# Patient Record
Sex: Male | Born: 1955 | Race: White | Hispanic: No | Smoking: Never smoker
Health system: Southern US, Community
[De-identification: ages and names within clinical notes are randomized; demographics above are authoritative.]

## PROBLEM LIST (undated history)

## (undated) DIAGNOSIS — E119 Type 2 diabetes mellitus without complications: Secondary | ICD-10-CM

## (undated) DIAGNOSIS — G4733 Obstructive sleep apnea (adult) (pediatric): Secondary | ICD-10-CM

## (undated) DIAGNOSIS — I2510 Atherosclerotic heart disease of native coronary artery without angina pectoris: Secondary | ICD-10-CM

## (undated) DIAGNOSIS — N183 Chronic kidney disease, stage 3 unspecified: Secondary | ICD-10-CM

## (undated) DIAGNOSIS — I5030 Unspecified diastolic (congestive) heart failure: Secondary | ICD-10-CM

## (undated) DIAGNOSIS — I10 Essential (primary) hypertension: Secondary | ICD-10-CM

## (undated) DIAGNOSIS — E785 Hyperlipidemia, unspecified: Secondary | ICD-10-CM

## (undated) DIAGNOSIS — C61 Malignant neoplasm of prostate: Secondary | ICD-10-CM

## (undated) DIAGNOSIS — I4821 Permanent atrial fibrillation: Secondary | ICD-10-CM

## (undated) HISTORY — PX: WRIST SURGERY: SHX841

---

## 2014-12-21 DIAGNOSIS — I252 Old myocardial infarction: Secondary | ICD-10-CM

## 2014-12-21 DIAGNOSIS — I420 Dilated cardiomyopathy: Secondary | ICD-10-CM | POA: Diagnosis present

## 2014-12-21 DIAGNOSIS — E1165 Type 2 diabetes mellitus with hyperglycemia: Secondary | ICD-10-CM | POA: Insufficient documentation

## 2014-12-21 DIAGNOSIS — I10 Essential (primary) hypertension: Secondary | ICD-10-CM | POA: Diagnosis present

## 2014-12-21 DIAGNOSIS — G4733 Obstructive sleep apnea (adult) (pediatric): Secondary | ICD-10-CM | POA: Insufficient documentation

## 2015-01-04 DIAGNOSIS — I482 Chronic atrial fibrillation, unspecified: Secondary | ICD-10-CM | POA: Insufficient documentation

## 2016-05-25 DIAGNOSIS — E669 Obesity, unspecified: Secondary | ICD-10-CM | POA: Insufficient documentation

## 2018-05-30 DIAGNOSIS — Z7901 Long term (current) use of anticoagulants: Secondary | ICD-10-CM

## 2019-07-31 DIAGNOSIS — N1832 Chronic kidney disease, stage 3b: Secondary | ICD-10-CM

## 2020-11-15 ENCOUNTER — Inpatient Hospital Stay
Admission: EM | Admit: 2020-11-15 | Discharge: 2020-11-18 | DRG: 308 | Disposition: A | Discharge status: Home / Self Care | Payer: Commercial Managed Care - PPO | Attending: Internal Medicine | Admitting: Internal Medicine

## 2020-11-15 ENCOUNTER — Emergency Department: Payer: Commercial Managed Care - PPO

## 2020-11-15 ENCOUNTER — Encounter: Payer: Self-pay | Admitting: *Deleted

## 2020-11-15 DIAGNOSIS — I25118 Atherosclerotic heart disease of native coronary artery with other forms of angina pectoris: Secondary | ICD-10-CM | POA: Diagnosis present

## 2020-11-15 DIAGNOSIS — Z20822 Contact with and (suspected) exposure to covid-19: Secondary | ICD-10-CM | POA: Diagnosis present

## 2020-11-15 DIAGNOSIS — I5033 Acute on chronic diastolic (congestive) heart failure: Secondary | ICD-10-CM | POA: Diagnosis present

## 2020-11-15 DIAGNOSIS — I4891 Unspecified atrial fibrillation: Secondary | ICD-10-CM | POA: Diagnosis not present

## 2020-11-15 DIAGNOSIS — I10 Essential (primary) hypertension: Secondary | ICD-10-CM | POA: Diagnosis present

## 2020-11-15 DIAGNOSIS — I272 Pulmonary hypertension, unspecified: Secondary | ICD-10-CM | POA: Diagnosis present

## 2020-11-15 DIAGNOSIS — E1122 Type 2 diabetes mellitus with diabetic chronic kidney disease: Secondary | ICD-10-CM

## 2020-11-15 DIAGNOSIS — I130 Hypertensive heart and chronic kidney disease with heart failure and stage 1 through stage 4 chronic kidney disease, or unspecified chronic kidney disease: Secondary | ICD-10-CM | POA: Diagnosis present

## 2020-11-15 DIAGNOSIS — I4821 Permanent atrial fibrillation: Secondary | ICD-10-CM | POA: Diagnosis not present

## 2020-11-15 DIAGNOSIS — D72828 Other elevated white blood cell count: Secondary | ICD-10-CM | POA: Diagnosis present

## 2020-11-15 DIAGNOSIS — Z8546 Personal history of malignant neoplasm of prostate: Secondary | ICD-10-CM

## 2020-11-15 DIAGNOSIS — E785 Hyperlipidemia, unspecified: Secondary | ICD-10-CM | POA: Diagnosis present

## 2020-11-15 DIAGNOSIS — Z7901 Long term (current) use of anticoagulants: Secondary | ICD-10-CM

## 2020-11-15 DIAGNOSIS — I5020 Unspecified systolic (congestive) heart failure: Secondary | ICD-10-CM

## 2020-11-15 DIAGNOSIS — Z7982 Long term (current) use of aspirin: Secondary | ICD-10-CM

## 2020-11-15 DIAGNOSIS — R079 Chest pain, unspecified: Secondary | ICD-10-CM

## 2020-11-15 DIAGNOSIS — I420 Dilated cardiomyopathy: Secondary | ICD-10-CM | POA: Diagnosis present

## 2020-11-15 DIAGNOSIS — Z79899 Other long term (current) drug therapy: Secondary | ICD-10-CM

## 2020-11-15 DIAGNOSIS — E039 Hypothyroidism, unspecified: Secondary | ICD-10-CM | POA: Diagnosis present

## 2020-11-15 DIAGNOSIS — Z9114 Patient's other noncompliance with medication regimen: Secondary | ICD-10-CM

## 2020-11-15 DIAGNOSIS — N179 Acute kidney failure, unspecified: Secondary | ICD-10-CM | POA: Diagnosis present

## 2020-11-15 DIAGNOSIS — N1832 Chronic kidney disease, stage 3b: Secondary | ICD-10-CM

## 2020-11-15 DIAGNOSIS — E1165 Type 2 diabetes mellitus with hyperglycemia: Secondary | ICD-10-CM

## 2020-11-15 DIAGNOSIS — I5032 Chronic diastolic (congestive) heart failure: Secondary | ICD-10-CM

## 2020-11-15 DIAGNOSIS — Z7984 Long term (current) use of oral hypoglycemic drugs: Secondary | ICD-10-CM

## 2020-11-15 DIAGNOSIS — I252 Old myocardial infarction: Secondary | ICD-10-CM

## 2020-11-15 DIAGNOSIS — R7989 Other specified abnormal findings of blood chemistry: Secondary | ICD-10-CM

## 2020-11-15 DIAGNOSIS — Z6837 Body mass index (BMI) 37.0-37.9, adult: Secondary | ICD-10-CM

## 2020-11-15 DIAGNOSIS — G4733 Obstructive sleep apnea (adult) (pediatric): Secondary | ICD-10-CM | POA: Diagnosis present

## 2020-11-15 DIAGNOSIS — R778 Other specified abnormalities of plasma proteins: Secondary | ICD-10-CM

## 2020-11-15 DIAGNOSIS — I248 Other forms of acute ischemic heart disease: Secondary | ICD-10-CM | POA: Diagnosis present

## 2020-11-15 DIAGNOSIS — N183 Chronic kidney disease, stage 3 unspecified: Secondary | ICD-10-CM

## 2020-11-15 DIAGNOSIS — I5043 Acute on chronic combined systolic (congestive) and diastolic (congestive) heart failure: Secondary | ICD-10-CM | POA: Diagnosis present

## 2020-11-15 DIAGNOSIS — Z9119 Patient's noncompliance with other medical treatment and regimen: Secondary | ICD-10-CM

## 2020-11-15 DIAGNOSIS — Z955 Presence of coronary angioplasty implant and graft: Secondary | ICD-10-CM

## 2020-11-15 HISTORY — DX: Essential (primary) hypertension: I10

## 2020-11-15 HISTORY — DX: Type 2 diabetes mellitus without complications: E11.9

## 2020-11-15 HISTORY — DX: Hyperlipidemia, unspecified: E78.5

## 2020-11-15 HISTORY — DX: Chronic kidney disease, stage 3 unspecified: N18.30

## 2020-11-15 HISTORY — DX: Malignant neoplasm of prostate: C61

## 2020-11-15 HISTORY — DX: Morbid (severe) obesity due to excess calories: E66.01

## 2020-11-15 HISTORY — DX: Obstructive sleep apnea (adult) (pediatric): G47.33

## 2020-11-15 HISTORY — DX: Permanent atrial fibrillation: I48.21

## 2020-11-15 HISTORY — DX: Atherosclerotic heart disease of native coronary artery without angina pectoris: I25.10

## 2020-11-15 HISTORY — DX: Unspecified diastolic (congestive) heart failure: I50.30

## 2020-11-15 LAB — BASIC METABOLIC PANEL
Anion gap: 14 (ref 5–15)
BUN: 41 mg/dL — ABNORMAL HIGH (ref 8–23)
CO2: 21 mmol/L — ABNORMAL LOW (ref 22–32)
Calcium: 9.2 mg/dL (ref 8.9–10.3)
Chloride: 100 mmol/L (ref 98–111)
Creatinine, Ser: 2.07 mg/dL — ABNORMAL HIGH (ref 0.61–1.24)
GFR, Estimated: 35 mL/min — ABNORMAL LOW (ref >60)
Glucose, Bld: 282 mg/dL — ABNORMAL HIGH (ref 70–99)
Potassium: 3.9 mmol/L (ref 3.5–5.1)
Sodium: 135 mmol/L (ref 135–145)

## 2020-11-15 LAB — CBC WITH DIFFERENTIAL/PLATELET
Abs Immature Granulocytes: 0.15 10*3/uL — ABNORMAL HIGH (ref 0.00–0.07)
Basophils Absolute: 0.1 10*3/uL (ref 0.0–0.1)
Basophils Relative: 1 %
Eosinophils Absolute: 0.2 10*3/uL (ref 0.0–0.5)
Eosinophils Relative: 2 %
HCT: 50.5 % (ref 39.0–52.0)
Hemoglobin: 16.2 g/dL (ref 13.0–17.0)
Immature Granulocytes: 1 %
Lymphocytes Relative: 7 %
Lymphs Abs: 0.9 10*3/uL (ref 0.7–4.0)
MCH: 26.5 pg (ref 26.0–34.0)
MCHC: 32.1 g/dL (ref 30.0–36.0)
MCV: 82.7 fL (ref 80.0–100.0)
Monocytes Absolute: 1.3 10*3/uL — ABNORMAL HIGH (ref 0.1–1.0)
Monocytes Relative: 10 %
Neutro Abs: 10.7 10*3/uL — ABNORMAL HIGH (ref 1.7–7.7)
Neutrophils Relative %: 79 %
Platelets: 280 10*3/uL (ref 150–400)
RBC: 6.11 MIL/uL — ABNORMAL HIGH (ref 4.22–5.81)
RDW: 17.8 % — ABNORMAL HIGH (ref 11.5–15.5)
WBC: 13.3 10*3/uL — ABNORMAL HIGH (ref 4.0–10.5)
nRBC: 0 % (ref 0.0–0.2)

## 2020-11-15 LAB — RESP PANEL BY RT-PCR (FLU A&B, COVID) ARPGX2
Influenza A by PCR: NEGATIVE
Influenza B by PCR: NEGATIVE
SARS Coronavirus 2 by RT PCR: NEGATIVE

## 2020-11-15 LAB — TROPONIN I (HIGH SENSITIVITY): Troponin I (High Sensitivity): 83 ng/L — ABNORMAL HIGH (ref <18)

## 2020-11-15 MED ORDER — METOPROLOL TARTRATE 25 MG PO TABS: 12.5000 mg | ORAL_TABLET | Freq: Two times a day (BID) | ORAL | Status: DC

## 2020-11-15 MED ORDER — SODIUM CHLORIDE 0.9 % IV SOLN: INTRAVENOUS | Status: AC

## 2020-11-15 MED ORDER — ATORVASTATIN CALCIUM 20 MG PO TABS
40.0000 mg | ORAL_TABLET | Freq: Every day | ORAL | Status: DC
  Administered 2020-11-16 – 2020-11-18 (×3): 40 mg via ORAL
  Filled 2020-11-15 (×3): qty 2

## 2020-11-15 MED ORDER — ACETAMINOPHEN 325 MG PO TABS: 650.0000 mg | ORAL_TABLET | ORAL | Status: DC | PRN

## 2020-11-15 MED ORDER — INSULIN ASPART 100 UNIT/ML ~~LOC~~ SOLN
0.0000 [IU] | Freq: Three times a day (TID) | SUBCUTANEOUS | Status: DC
  Administered 2020-11-16: 5 [IU] via SUBCUTANEOUS
  Administered 2020-11-16: 3 [IU] via SUBCUTANEOUS
  Administered 2020-11-16: 11 [IU] via SUBCUTANEOUS
  Administered 2020-11-17: 3 [IU] via SUBCUTANEOUS
  Administered 2020-11-17: 2 [IU] via SUBCUTANEOUS
  Administered 2020-11-17 – 2020-11-18 (×2): 3 [IU] via SUBCUTANEOUS
  Administered 2020-11-18: 5 [IU] via SUBCUTANEOUS
  Filled 2020-11-15 (×8): qty 1

## 2020-11-15 MED ORDER — ASPIRIN 81 MG PO CHEW
324.0000 mg | CHEWABLE_TABLET | Freq: Once | ORAL | Status: AC
  Administered 2020-11-15: 324 mg via ORAL
  Filled 2020-11-15: qty 4

## 2020-11-15 MED ORDER — INSULIN ASPART 100 UNIT/ML ~~LOC~~ SOLN
0.0000 [IU] | Freq: Every day | SUBCUTANEOUS | Status: DC
  Administered 2020-11-16 (×2): 3 [IU] via SUBCUTANEOUS
  Administered 2020-11-17: 2 [IU] via SUBCUTANEOUS
  Filled 2020-11-15 (×3): qty 1

## 2020-11-15 MED ORDER — NITROGLYCERIN 0.4 MG SL SUBL: 0.4000 mg | SUBLINGUAL_TABLET | SUBLINGUAL | Status: DC | PRN

## 2020-11-15 MED ORDER — SODIUM CHLORIDE 0.9 % IV SOLN: Freq: Once | INTRAVENOUS | Status: AC

## 2020-11-15 MED ORDER — ASPIRIN EC 81 MG PO TBEC
81.0000 mg | DELAYED_RELEASE_TABLET | Freq: Every day | ORAL | Status: DC
  Administered 2020-11-16 – 2020-11-18 (×3): 81 mg via ORAL
  Filled 2020-11-15 (×3): qty 1

## 2020-11-15 MED ORDER — ONDANSETRON HCL 4 MG/2ML IJ SOLN: 4.0000 mg | Freq: Four times a day (QID) | INTRAMUSCULAR | Status: DC | PRN

## 2020-11-15 MED ORDER — DILTIAZEM HCL 25 MG/5ML IV SOLN
15.0000 mg | Freq: Once | INTRAVENOUS | Status: AC
  Administered 2020-11-15: 15 mg via INTRAVENOUS
  Filled 2020-11-15: qty 5

## 2020-11-15 NOTE — ED Provider Notes (Signed)
[facility] Emergency Department Provider Note  ____________________________________________   I have reviewed the triage vital signs and the nursing notes.   HISTORY  Chief Complaint Chest Pain   History limited by: Not Limited   HPI Joshua Landry is a 64 y.o. male who presents to the emergency department today because of concern for chest pain. The patient states that it was located in the center and left chest. It started this evening as he was getting ready to go into work. The patient does state that he has been having some cough and weakness. This has been present for the past week. The patient says that after the chest pain started he took some nitroglycerin. It did improve after the second nitroglycerin. The patient does have history of heart attack, however has not had his medication in one month due to insurance issues.     Records reviewed. Per medical record review patient has a history of CHF, DM, afib.   Past Medical History:  Diagnosis Date  . A-fib ([place])   . CHF (congestive heart failure) ([place])   . Diabetes mellitus without complication ([place])   . Hyperlipidemia   . Prostate CA ([place])     There are no problems to display for this patient.   History reviewed. No pertinent surgical history.  Prior to Admission medications   Not on File    Allergies Patient has no known allergies.  History reviewed. No pertinent family history.  Social History Social History   Tobacco Use  . Smoking status: Never Smoker  Vaping Use  . Vaping Use: Never used  Substance Use Topics  . Alcohol use: Yes    Comment: social  . Drug use: Never    Review of Systems Constitutional: No fever/chills Eyes: No visual changes. ENT: No sore throat. Cardiovascular: Positive chest pain. Respiratory: Positive cough  Gastrointestinal: No abdominal pain.  No nausea, no vomiting.  No diarrhea.   Genitourinary: Negative for dysuria. Musculoskeletal:  Negative for back pain. Skin: Negative for rash. Neurological: Negative for headaches, focal weakness or numbness.  ____________________________________________   PHYSICAL EXAM:  VITAL SIGNS: ED Triage Vitals  Enc Vitals Group     BP --      Pulse Rate 11/15/20 2156 65     Resp --      Temp 11/15/20 2156 98 F (36.7 C)     Temp src --      SpO2 11/15/20 2156 97 %     Weight 11/15/20 2157 270 lb (122.5 kg)     Height 11/15/20 2157 5\' 11"  (1.803 m)     Head Circumference --      Peak Flow --      Pain Score 11/15/20 2157 2   Constitutional: Alert and oriented.  Eyes: Conjunctivae are normal.  ENT      Head: Normocephalic and atraumatic.      Nose: No congestion/rhinnorhea.      Mouth/Throat: Mucous membranes are moist.      Neck: No stridor. Hematological/Lymphatic/Immunilogical: No cervical lymphadenopathy. Cardiovascular: Irregular rate and rhythm.  No murmurs, rubs, or gallops.  Respiratory: Normal respiratory effort without tachypnea nor retractions. Breath sounds are clear and equal bilaterally. No wheezes/rales/rhonchi. Gastrointestinal: Soft and non tender. No rebound. No guarding.  Genitourinary: Deferred Musculoskeletal: Normal range of motion in all extremities. Positive for lower extremity edema. Neurologic:  Normal speech and language. No gross focal neurologic deficits are appreciated.  Skin:  Skin is warm, dry and intact. No rash  noted. Psychiatric: Mood and affect are normal. Speech and behavior are normal. Patient exhibits appropriate insight and judgment.  ____________________________________________    LABS (pertinent positives/negatives)  Trop hs 83 CBC wbc 13.3, hgb 16.2, plt 280 BMP na 135, k 3.9, glu 282, cr 2.07 ____________________________________________   EKG  I, Nance Pear, attending physician, personally viewed and interpreted this EKG  EKG Time: 2154 Rate: 163 Rhythm: atrial fibrillation with RVR Axis: left axis  deviation Intervals: qtc 526 QRS: narrow, q waves v1, v2 ST changes: no st elevation Impression: abnormal ekg  ____________________________________________    RADIOLOGY  CXR No active disease, cardiomegaly ____________________________________________   PROCEDURES  Procedures  ____________________________________________   INITIAL IMPRESSION / ASSESSMENT AND PLAN / ED COURSE  Pertinent labs & imaging results that were available during my care of the patient were reviewed by me and considered in my medical decision making (see chart for details).   Patient presented to the emergency department today because of concerns for chest pain.  Patient was found to be in A. fib with RVR.  Patient does have a history of myocardial infarction.  Patient was given dose of diltiazem through the IV which did help slow his rate down.  However given chest pain and elevation of troponin do think patient would benefit from further cardiac evaluation and work-up.  ____________________________________________   FINAL CLINICAL IMPRESSION(S) / ED DIAGNOSES  Final diagnoses:  Elevated troponin  Chest pain, unspecified type  Atrial fibrillation with RVR ([place])     Note: This dictation was prepared with Dragon dictation. Any transcriptional errors that result from this process are unintentional     Nance Pear, MD 11/15/20 2314

## 2020-11-15 NOTE — ED Triage Notes (Signed)
Pt is an Corporate treasurer at [employer]. Chest pain on and off throughout day. At 9pm after 2 nitros he still had pain so called 911. Pt with hx of afivb and MI. Only taking asa once a day r/t insuraqnce. Has a MD appoint for tomorrow to restart meds

## 2020-11-15 NOTE — H&P (Addendum)
History and Physical    Joshua Landry IRW:[id number] DOB: 09-13-1956 DOA: 11/15/2020  PCP: Patient, No Pcp Per   Patient coming from: home  I have personally briefly reviewed patient's old medical records in [place]  Chief Complaint: Chest pain, shortness of breath  HPI: Joshua Landry is a 64 y.o. male with medical history significant for CKD 3B, atrial fibrillation, diastolic heart failure and cardiomyopathy , CAD with history of MI x2 and prostate cancer, who has not taken medication in the past month due to insurance issues, who presents to the emergency room with intermittent chest pain.  He said he was in his usual state of health until a week ago when he developed a cough associated with shortness of breath, that is worse when he takes a deep breath.  He is fully vaccinated against Covid.  He said today while still sitting in his car, he developed retrosternal chest pain, similar to his previous MI but not quite as intense., pain is retrosternal, nonradiating of moderate intensity.  It was relieved after his second nitroglycerin.  He denies associated nausea, vomiting, diaphoresis, palpitations or lightheadedness. He denies lower extremity pain or swelling.  ED Course: On arrival in the emergency room he was found to be in rapid A. fib with a rate in the 160s, BP 155/129, respirations 22 with O2 sat 98% on room air.  Blood work significant for first troponin of 80, leukocytosis of 13,000, creatinine of 2.07 which is around his baseline of 1.8, and blood sugar of 282. EKG as reviewed by me : A. fib with RVR of 163 Imaging: Chest x-ray with no acute findings  Patient was treated with a single diltiazem bolus in the emergency room with improvement in rate to 100-110.  Hospitalist consulted for admission.  Review of Systems: As per HPI otherwise all other systems on review of systems negative.    Past Medical History:  Diagnosis Date  . A-fib ([place])   . CHF (congestive heart  failure) ([place])   . Diabetes mellitus without complication ([place])   . Hyperlipidemia   . Prostate CA [facility])     History reviewed. No pertinent surgical history.   reports that he has never smoked. He does not have any smokeless tobacco history on file. He reports current alcohol use. He reports that he does not use drugs.  No Known Allergies  History reviewed. No pertinent family history.    Prior to Admission medications   Not on File    Physical Exam: Vitals:   11/15/20 2156 11/15/20 2157 11/15/20 2200 11/15/20 2230  BP:   (!) 155/129 (!) 167/93  Pulse: 65  70 (!) 148  Resp:   (!) 22 16  Temp: 98 F (36.7 C)     SpO2: 97%  98% 95%  Weight:  122.5 kg    Height:  5\' 11"  (1.803 m)       Vitals:   11/15/20 2156 11/15/20 2157 11/15/20 2200 11/15/20 2230  BP:   (!) 155/129 (!) 167/93  Pulse: 65  70 (!) 148  Resp:   (!) 22 16  Temp: 98 F (36.7 C)     SpO2: 97%  98% 95%  Weight:  122.5 kg    Height:  5\' 11"  (1.803 m)        Constitutional: Alert and oriented x 3 .  Appears comfortable HEENT:      Head: Normocephalic and atraumatic.         Eyes: PERLA, EOMI, Conjunctivae  are normal. Sclera is non-icteric.       Mouth/Throat: Mucous membranes are moist.       Neck: Supple with no signs of meningismus. Cardiovascular:  Irregularly irregular, tachycardic. No murmurs, gallops, or rubs. 2+ symmetrical distal pulses are present . No JVD. No  LE edema Respiratory: Respiratory effort somewhat increased..Lungs sounds clear bilaterally. No wheezes, crackles, or rhonchi.  Gastrointestinal: Soft, non tender, and non distended with positive bowel sounds.  Genitourinary: No CVA tenderness. Musculoskeletal: Nontender with normal range of motion in all extremities. No cyanosis, or erythema of extremities. Neurologic:  Face is symmetric. Moving all extremities. No gross focal neurologic deficits . Skin: Skin is warm, dry.  No rash or ulcers Psychiatric: Mood and affect are normal     Labs on Admission: I have personally reviewed following labs and imaging studies  CBC: Recent Labs  Lab 11/15/20 2155  WBC 13.3*  NEUTROABS 10.7*  HGB 16.2  HCT 50.5  MCV 82.7  PLT 712   Basic Metabolic Panel: Recent Labs  Lab 11/15/20 2155  NA 135  K 3.9  CL 100  CO2 21*  GLUCOSE 282*  BUN 41*  CREATININE 2.07*  CALCIUM 9.2   GFR: Estimated Creatinine Clearance: 46.8 mL/min (A) (by C-G formula based on SCr of 2.07 mg/dL (H)). Liver Function Tests: No results for input(s): AST, ALT, ALKPHOS, BILITOT, PROT, ALBUMIN in the last 168 hours. No results for input(s): LIPASE, AMYLASE in the last 168 hours. No results for input(s): AMMONIA in the last 168 hours. Coagulation Profile: No results for input(s): INR, PROTIME in the last 168 hours. Cardiac Enzymes: No results for input(s): CKTOTAL, CKMB, CKMBINDEX, TROPONINI in the last 168 hours. BNP (last 3 results) No results for input(s): PROBNP in the last 8760 hours. HbA1C: No results for input(s): HGBA1C in the last 72 hours. CBG: No results for input(s): GLUCAP in the last 168 hours. Lipid Profile: No results for input(s): CHOL, HDL, LDLCALC, TRIG, CHOLHDL, LDLDIRECT in the last 72 hours. Thyroid Function Tests: No results for input(s): TSH, T4TOTAL, FREET4, T3FREE, THYROIDAB in the last 72 hours. Anemia Panel: No results for input(s): VITAMINB12, FOLATE, FERRITIN, TIBC, IRON, RETICCTPCT in the last 72 hours. Urine analysis: No results found for: COLORURINE, APPEARANCEUR, LABSPEC, [place], GLUCOSEU, HGBUR, BILIRUBINUR, KETONESUR, PROTEINUR, UROBILINOGEN, NITRITE, LEUKOCYTESUR  Radiological Exams on Admission: DG Chest Portable 1 View  Result Date: 11/15/2020 CLINICAL DATA:  Chest pain EXAM: PORTABLE CHEST 1 VIEW COMPARISON:  None. FINDINGS: No focal airspace disease or pleural effusion. Borderline to mild cardiomegaly. No pneumothorax. IMPRESSION: No active disease. Borderline to mild cardiomegaly.  Electronically Signed   By: Donavan Foil M.D.   On: 11/15/2020 22:24     Assessment/Plan 64 year old male with history of CKD 3b, atrial fibrillation, diastolic heart failure and cardiomyopathy , CAD with history of MI x2 and prostate cancer, who has not taken medication in the past month presenting with chest pain times few hours and 1 week history of cough associated with shortness of breath.  He is fully vaccinated against Covid.     Atrial fibrillation with rapid ventricular response ([place]) -Patient has been out of his medication for a month.  Took metoprolol and Eliquis -Achieved fair rate control after bolus diltiazem dose in the emergency room as well as a fluid bolus -Resume home metoprolol.   No Eliquis for now as patient is being placed on a heparin infusion -Echocardiogram in the a.m.    Possible NSTEMI (elevated troponin, chest pain)  History of MI (myocardial infarction) -Patient with history of MI presenting with chest pain relieved by nitroglycerin, and troponin elevation to 80.  But without acute ST-T wave changes -Chest pain has both typical and atypical features. -Suspecting ACS, low suspicion for PE -First troponin of 80, continue to trend -Heparin infusion -Aspirin, atorvastatin, metoprolol, nitroglycerin as needed chest pain with morphine for breakthrough -Cardiology consult    Chronic diastolic CHF (congestive heart failure) (HCC) History of dilated cardiomyopathy -Appears stable.  Patient appears euvolemic -Has not taken medication in over a month -Continue metoprolol.  No ACE inhibitor due to renal function  Cough, and shortness of breath -Patient reports a 1 week history of cough and shortness of breath worse with deep inspiration -Covid and flu negative.  Patient has received his third Covid vaccine -WBC elevated at 13,000 and chest x-ray clear -Antitussives and continue to monitor -We will get procalcitonin.     Type 2 diabetes mellitus with stage 3  chronic kidney disease (HCC) -Regular insulin sliding scale coverage -Follow A1c    Stage 3b chronic kidney disease (HCC) -Creatinine 2.07.  Most recent creatinine was 1.84 September 2020 on Care Everywhere -Continue to monitor     DVT prophylaxis: Heparin drip Code Status: full code  Family Communication:  none  Disposition Plan: Back to previous home environment Consults called: Cardiology Status: Observation    Joshua Masse MD [facility]     11/15/2020, 11:38 PM

## 2020-11-16 ENCOUNTER — Observation Stay (HOSPITAL_COMMUNITY)
Admit: 2020-11-16 | Discharge: 2020-11-16 | Disposition: A | Discharge status: Home / Self Care | Payer: Commercial Managed Care - PPO | Attending: Internal Medicine | Admitting: Internal Medicine

## 2020-11-16 ENCOUNTER — Other Ambulatory Visit: Payer: Self-pay

## 2020-11-16 DIAGNOSIS — I248 Other forms of acute ischemic heart disease: Secondary | ICD-10-CM | POA: Diagnosis present

## 2020-11-16 DIAGNOSIS — E1122 Type 2 diabetes mellitus with diabetic chronic kidney disease: Secondary | ICD-10-CM | POA: Diagnosis present

## 2020-11-16 DIAGNOSIS — N1832 Chronic kidney disease, stage 3b: Secondary | ICD-10-CM | POA: Diagnosis present

## 2020-11-16 DIAGNOSIS — E785 Hyperlipidemia, unspecified: Secondary | ICD-10-CM

## 2020-11-16 DIAGNOSIS — R079 Chest pain, unspecified: Secondary | ICD-10-CM

## 2020-11-16 DIAGNOSIS — Z7901 Long term (current) use of anticoagulants: Secondary | ICD-10-CM | POA: Diagnosis not present

## 2020-11-16 DIAGNOSIS — I130 Hypertensive heart and chronic kidney disease with heart failure and stage 1 through stage 4 chronic kidney disease, or unspecified chronic kidney disease: Secondary | ICD-10-CM | POA: Diagnosis present

## 2020-11-16 DIAGNOSIS — R072 Precordial pain: Secondary | ICD-10-CM | POA: Diagnosis not present

## 2020-11-16 DIAGNOSIS — D72828 Other elevated white blood cell count: Secondary | ICD-10-CM | POA: Diagnosis present

## 2020-11-16 DIAGNOSIS — I4891 Unspecified atrial fibrillation: Secondary | ICD-10-CM | POA: Diagnosis not present

## 2020-11-16 DIAGNOSIS — I509 Heart failure, unspecified: Secondary | ICD-10-CM | POA: Diagnosis not present

## 2020-11-16 DIAGNOSIS — Z8546 Personal history of malignant neoplasm of prostate: Secondary | ICD-10-CM | POA: Diagnosis not present

## 2020-11-16 DIAGNOSIS — I4821 Permanent atrial fibrillation: Secondary | ICD-10-CM | POA: Diagnosis present

## 2020-11-16 DIAGNOSIS — I5033 Acute on chronic diastolic (congestive) heart failure: Secondary | ICD-10-CM

## 2020-11-16 DIAGNOSIS — Z7982 Long term (current) use of aspirin: Secondary | ICD-10-CM | POA: Diagnosis not present

## 2020-11-16 DIAGNOSIS — Z9119 Patient's noncompliance with other medical treatment and regimen: Secondary | ICD-10-CM | POA: Diagnosis not present

## 2020-11-16 DIAGNOSIS — G4733 Obstructive sleep apnea (adult) (pediatric): Secondary | ICD-10-CM | POA: Diagnosis present

## 2020-11-16 DIAGNOSIS — I420 Dilated cardiomyopathy: Secondary | ICD-10-CM | POA: Diagnosis present

## 2020-11-16 DIAGNOSIS — I5043 Acute on chronic combined systolic (congestive) and diastolic (congestive) heart failure: Secondary | ICD-10-CM | POA: Diagnosis present

## 2020-11-16 DIAGNOSIS — R778 Other specified abnormalities of plasma proteins: Secondary | ICD-10-CM | POA: Diagnosis not present

## 2020-11-16 DIAGNOSIS — I252 Old myocardial infarction: Secondary | ICD-10-CM | POA: Diagnosis not present

## 2020-11-16 DIAGNOSIS — E1165 Type 2 diabetes mellitus with hyperglycemia: Secondary | ICD-10-CM | POA: Diagnosis present

## 2020-11-16 DIAGNOSIS — Z79899 Other long term (current) drug therapy: Secondary | ICD-10-CM | POA: Diagnosis not present

## 2020-11-16 DIAGNOSIS — I5020 Unspecified systolic (congestive) heart failure: Secondary | ICD-10-CM | POA: Diagnosis not present

## 2020-11-16 DIAGNOSIS — Z9114 Patient's other noncompliance with medication regimen: Secondary | ICD-10-CM | POA: Diagnosis not present

## 2020-11-16 DIAGNOSIS — Z955 Presence of coronary angioplasty implant and graft: Secondary | ICD-10-CM | POA: Diagnosis not present

## 2020-11-16 DIAGNOSIS — N179 Acute kidney failure, unspecified: Secondary | ICD-10-CM | POA: Diagnosis present

## 2020-11-16 DIAGNOSIS — I482 Chronic atrial fibrillation, unspecified: Secondary | ICD-10-CM | POA: Diagnosis not present

## 2020-11-16 DIAGNOSIS — I25118 Atherosclerotic heart disease of native coronary artery with other forms of angina pectoris: Secondary | ICD-10-CM | POA: Diagnosis not present

## 2020-11-16 DIAGNOSIS — E1169 Type 2 diabetes mellitus with other specified complication: Secondary | ICD-10-CM

## 2020-11-16 DIAGNOSIS — I272 Pulmonary hypertension, unspecified: Secondary | ICD-10-CM | POA: Diagnosis present

## 2020-11-16 DIAGNOSIS — N183 Chronic kidney disease, stage 3 unspecified: Secondary | ICD-10-CM

## 2020-11-16 DIAGNOSIS — Z20822 Contact with and (suspected) exposure to covid-19: Secondary | ICD-10-CM | POA: Diagnosis present

## 2020-11-16 DIAGNOSIS — E039 Hypothyroidism, unspecified: Secondary | ICD-10-CM | POA: Diagnosis present

## 2020-11-16 LAB — ECHOCARDIOGRAM COMPLETE
AR max vel: 2.69 cm2
AV Area VTI: 3.31 cm2
AV Area mean vel: 2.6 cm2
AV Mean grad: 1 mmHg
AV Peak grad: 1.7 mmHg
Ao pk vel: 0.65 m/s
Area-P 1/2: 5.02 cm2
Calc EF: 46.1 %
Height: 71 in
S' Lateral: 4.21 cm
Single Plane A2C EF: 41.9 %
Single Plane A4C EF: 49.3 %
Weight: 4320 oz

## 2020-11-16 LAB — CBC
HCT: 44.6 % (ref 39.0–52.0)
Hemoglobin: 14 g/dL (ref 13.0–17.0)
MCH: 26.1 pg (ref 26.0–34.0)
MCHC: 31.4 g/dL (ref 30.0–36.0)
MCV: 83.2 fL (ref 80.0–100.0)
Platelets: 225 10*3/uL (ref 150–400)
RBC: 5.36 MIL/uL (ref 4.22–5.81)
RDW: 16.7 % — ABNORMAL HIGH (ref 11.5–15.5)
WBC: 8.9 10*3/uL (ref 4.0–10.5)
nRBC: 0 % (ref 0.0–0.2)

## 2020-11-16 LAB — LIPID PANEL
Cholesterol: 148 mg/dL (ref 0–200)
HDL: 23 mg/dL — ABNORMAL LOW (ref >40)
LDL Cholesterol: 95 mg/dL (ref 0–99)
Total CHOL/HDL Ratio: 6.4 RATIO
Triglycerides: 152 mg/dL — ABNORMAL HIGH (ref <150)
VLDL: 30 mg/dL (ref 0–40)

## 2020-11-16 LAB — BASIC METABOLIC PANEL
Anion gap: 10 (ref 5–15)
BUN: 36 mg/dL — ABNORMAL HIGH (ref 8–23)
CO2: 22 mmol/L (ref 22–32)
Calcium: 8.4 mg/dL — ABNORMAL LOW (ref 8.9–10.3)
Chloride: 105 mmol/L (ref 98–111)
Creatinine, Ser: 1.79 mg/dL — ABNORMAL HIGH (ref 0.61–1.24)
GFR, Estimated: 41 mL/min — ABNORMAL LOW (ref >60)
Glucose, Bld: 244 mg/dL — ABNORMAL HIGH (ref 70–99)
Potassium: 4.2 mmol/L (ref 3.5–5.1)
Sodium: 137 mmol/L (ref 135–145)

## 2020-11-16 LAB — GLUCOSE, CAPILLARY: Glucose-Capillary: 251 mg/dL — ABNORMAL HIGH (ref 70–99)

## 2020-11-16 LAB — PROTIME-INR
INR: 1 (ref 0.8–1.2)
Prothrombin Time: 12.9 seconds (ref 11.4–15.2)

## 2020-11-16 LAB — CBG MONITORING, ED
Glucose-Capillary: 180 mg/dL — ABNORMAL HIGH (ref 70–99)
Glucose-Capillary: 213 mg/dL — ABNORMAL HIGH (ref 70–99)
Glucose-Capillary: 254 mg/dL — ABNORMAL HIGH (ref 70–99)
Glucose-Capillary: 320 mg/dL — ABNORMAL HIGH (ref 70–99)

## 2020-11-16 LAB — HEMOGLOBIN A1C
Hgb A1c MFr Bld: 10.3 % — ABNORMAL HIGH (ref 4.8–5.6)
Mean Plasma Glucose: 248.91 mg/dL

## 2020-11-16 LAB — TROPONIN I (HIGH SENSITIVITY): Troponin I (High Sensitivity): 65 ng/L — ABNORMAL HIGH (ref <18)

## 2020-11-16 LAB — BRAIN NATRIURETIC PEPTIDE: B Natriuretic Peptide: 244.1 pg/mL — ABNORMAL HIGH (ref 0.0–100.0)

## 2020-11-16 LAB — TSH: TSH: 10.115 u[IU]/mL — ABNORMAL HIGH (ref 0.350–4.500)

## 2020-11-16 LAB — HEPARIN LEVEL (UNFRACTIONATED)
Heparin Unfractionated: <0.1 IU/mL — ABNORMAL LOW (ref 0.30–0.70)
Heparin Unfractionated: 0.36 IU/mL (ref 0.30–0.70)

## 2020-11-16 LAB — MAGNESIUM: Magnesium: 2.1 mg/dL (ref 1.7–2.4)

## 2020-11-16 LAB — APTT: aPTT: 28 seconds (ref 24–36)

## 2020-11-16 LAB — PROCALCITONIN: Procalcitonin: 0.11 ng/mL

## 2020-11-16 MED ORDER — PERFLUTREN LIPID MICROSPHERE
1.0000 mL | INTRAVENOUS | Status: AC | PRN
  Administered 2020-11-16: 2 mL via INTRAVENOUS
  Filled 2020-11-16: qty 10

## 2020-11-16 MED ORDER — HEPARIN BOLUS VIA INFUSION
4000.0000 [IU] | Freq: Once | INTRAVENOUS | Status: AC
  Administered 2020-11-16: 4000 [IU] via INTRAVENOUS
  Filled 2020-11-16: qty 4000

## 2020-11-16 MED ORDER — LEVOTHYROXINE SODIUM 50 MCG PO TABS
50.0000 ug | ORAL_TABLET | Freq: Every day | ORAL | Status: DC
  Administered 2020-11-17 – 2020-11-18 (×2): 50 ug via ORAL
  Filled 2020-11-16 (×2): qty 1

## 2020-11-16 MED ORDER — FUROSEMIDE 10 MG/ML IJ SOLN
20.0000 mg | Freq: Two times a day (BID) | INTRAMUSCULAR | Status: DC
  Administered 2020-11-16 – 2020-11-17 (×3): 20 mg via INTRAVENOUS
  Filled 2020-11-16: qty 2
  Filled 2020-11-16: qty 4
  Filled 2020-11-16: qty 2

## 2020-11-16 MED ORDER — HEPARIN (PORCINE) 25000 UT/250ML-% IV SOLN
1300.0000 [IU]/h | INTRAVENOUS | Status: DC
  Administered 2020-11-16: 1300 [IU]/h via INTRAVENOUS
  Filled 2020-11-16 (×2): qty 250

## 2020-11-16 MED ORDER — METOPROLOL TARTRATE 50 MG PO TABS
50.0000 mg | ORAL_TABLET | Freq: Two times a day (BID) | ORAL | Status: DC
  Administered 2020-11-16 – 2020-11-17 (×4): 50 mg via ORAL
  Filled 2020-11-16 (×4): qty 1

## 2020-11-16 MED ORDER — INSULIN GLARGINE 100 UNIT/ML ~~LOC~~ SOLN
16.0000 [IU] | Freq: Every day | SUBCUTANEOUS | Status: DC
  Administered 2020-11-16 – 2020-11-17 (×2): 16 [IU] via SUBCUTANEOUS
  Filled 2020-11-16 (×4): qty 0.16

## 2020-11-16 NOTE — Progress Notes (Signed)
ANTICOAGULATION CONSULT NOTE  Pharmacy Consult for heparin Indication: ACS/STEMI/afib  No Known Allergies  Patient Measurements: Height: 5\' 11"  (180.3 cm) Weight: 121.7 kg (268 lb 4.8 oz) IBW/kg (Calculated) : 75.3 Heparin Dosing Weight: 102 kg  Vital Signs: Temp: 97.7 F (36.5 C) (12/28 1926) Temp Source: Oral (12/28 1926) BP: 110/70 (12/28 1926) Pulse Rate: 70 (12/28 1926)  Labs: Recent Labs    11/15/20 2155 11/16/20 0641 11/16/20 1303 11/16/20 1904  HGB 16.2 14.0  --   --   HCT 50.5 44.6  --   --   PLT 280 225  --   --   APTT  --   --  28  --   LABPROT  --   --  12.9  --   INR  --   --  1.0  --   HEPARINUNFRC  --   --  <0.10* 0.36  CREATININE 2.07* 1.79*  --   --   TROPONINIHS 83* 65*  --   --     Estimated Creatinine Clearance: 53.9 mL/min (A) (by C-G formula based on SCr of 1.79 mg/dL (H)).   Medical History: Past Medical History:  Diagnosis Date  . (HFpEF) heart failure with preserved ejection fraction ([place])   . CAD (coronary artery disease)   . Chronic kidney disease (CKD), stage III (moderate) (HCC)   . Diabetes mellitus without complication ([place])   . Essential hypertension   . Hyperlipidemia   . Morbid obesity ([place])   . OSA (obstructive sleep apnea)   . Permanent atrial fibrillation ([place])   . Prostate CA [facility])      Assessment: 64 year old male presented with SOB. Patient with h/o afib previously on Eliquis PTA. Patient unable to take medications for approximately one month because of insurance issues with starting new job. Pharmacy consulted for heparin drip pending r/o for possible invasive procedures.  Hgb: 16.2>14 Plts: 280>225  Goal of Therapy:  Heparin level 0.3-0.7 units/ml Monitor platelets by anticoagulation protocol: Yes   Date    Time    HL       Rate  12/28   1904    0.36       1300units/hr ; thera x1  Plan:  Therapeutic HL x1; Continue with heparin at current 1300 units/hr.  Recheck level in 6hrs on 12/29 @0200 . If  therapeutic x2; then check HL with CBC daily while on heparin drip.  Lorna Dibble, PharmD 11/16/2020,7:32 PM

## 2020-11-16 NOTE — Progress Notes (Signed)
Patient just had a 5 beat run of V Tach, asymptomatic. MD was made aware. MD states to call on call Cardiology doctor if any more concerns arise.

## 2020-11-16 NOTE — ED Notes (Signed)
Placed breakfast tray at bedside. Pt asleep at this time.

## 2020-11-16 NOTE — ED Notes (Signed)
Pt sitting on side of bed eating lunch. Attending came by and spoke with pt.

## 2020-11-16 NOTE — ED Notes (Signed)
Sent secure chat msg to receiving nurse.

## 2020-11-16 NOTE — Progress Notes (Signed)
ANTICOAGULATION CONSULT NOTE  Pharmacy Consult for heparin Indication: ACS/STEMI/afib  No Known Allergies  Patient Measurements: Height: 5\' 11"  (180.3 cm) Weight: 122.5 kg (270 lb) IBW/kg (Calculated) : 75.3 Heparin Dosing Weight: 102 kg  Vital Signs: Temp: 98.2 F (36.8 C) (12/28 0414) BP: 114/68 (12/28 1300) Pulse Rate: 86 (12/28 1300)  Labs: Recent Labs    11/15/20 2155 11/16/20 0641 11/16/20 1303  HGB 16.2 14.0  --   HCT 50.5 44.6  --   PLT 280 225  --   APTT  --   --  28  LABPROT  --   --  12.9  INR  --   --  1.0  HEPARINUNFRC  --   --  <0.10*  CREATININE 2.07* 1.79*  --   TROPONINIHS 83* 65*  --     Estimated Creatinine Clearance: 54.1 mL/min (A) (by C-G formula based on SCr of 1.79 mg/dL (H)).   Medical History: Past Medical History:  Diagnosis Date  . (HFpEF) heart failure with preserved ejection fraction ([place])   . CAD (coronary artery disease)   . Chronic kidney disease (CKD), stage III (moderate) (HCC)   . Diabetes mellitus without complication ([place])   . Essential hypertension   . Hyperlipidemia   . Morbid obesity ([place])   . OSA (obstructive sleep apnea)   . Permanent atrial fibrillation ([place])   . Prostate CA [facility])      Assessment: 64 year old male presented with SOB. Patient with h/o afib previously on Eliquis PTA. Patient unable to take medications for approximately one month because of insurance issues with starting new job. Pharmacy consulted for heparin drip pending r/o for possible invasive procedures.  Goal of Therapy:  Heparin level 0.3-0.7 units/ml Monitor platelets by anticoagulation protocol: Yes   Plan:  Heparin 4000 unit bolus followed by drip at 1300 units/hr. Initial HL < 0.10 as expected given recent noncompliance. Will follow HL for monitoring. Check HL at 1900. CBC daily while on heparin drip.  Tawnya Crook, PharmD 11/16/2020,2:52 PM

## 2020-11-16 NOTE — ED Notes (Signed)
Bed now ready. Messaged nurse to notify pt about to come up to floor.

## 2020-11-16 NOTE — Progress Notes (Signed)
*  PRELIMINARY RESULTS* Echocardiogram 2D Echocardiogram has been performed.  Sherrie Sport 11/16/2020, 2:26 PM

## 2020-11-16 NOTE — ED Notes (Signed)
Pt provided with water and ice per request.

## 2020-11-16 NOTE — Plan of Care (Signed)

## 2020-11-16 NOTE — Progress Notes (Signed)
PROGRESS NOTE    Joshua Landry  ZOX:[id number] DOB: 08/02/1956 DOA: 11/15/2020 PCP: Patient, No Pcp Per   Chief complaint. Shortness of breath. Brief Narrative:  Joshua Landry is a 64 y.o. male with medical history significant for CKD 3B, atrial fibrillation, diastolic heart failure and cardiomyopathy , CAD with history of MI x2 and prostate cancer, who has not taken medication in the past month due to insurance issues, who presents to the emergency room with intermittent chest pain.  He also complaining of secondary short of breath.  Upon arriving the emergency room, telemetry showed atrial fibrillation with RVR, he has a mild elevation of BNP, normal procalcitonin level. Patient was seen by cardiology, started on heparin and IV Lasix.   Assessment & Plan:   Principal Problem:   Atrial fibrillation with rapid ventricular response (HCC) Active Problems:   Type 2 diabetes mellitus with stage 3 chronic kidney disease (HCC)   Stage 3b chronic kidney disease (HCC)   Current use of long term anticoagulation   Dilated cardiomyopathy (HCC)   Essential hypertension   History of MI (myocardial infarction)   Elevated troponin   Chronic diastolic CHF (congestive heart failure) (HCC)   Chest pain   Rapid atrial fibrillation ([place])   #1. Permanent atrial fibrillation with rapid ventricle response. Mild elevation troponin with CAD. Acute on chronic diastolic congestive heart failure. Patient has been evaluated by cardiology, scheduled for stress test. Currently on heparin drip, also treated with IV Lasix. Patient symptoms seem to be better today. We will continue monitor and continue current treatment. Patient has no evidence of bacterial pneumonia.  2. Acute kidney injury on chronic kidney disease stage III. Renal function seem to be better after diuretics.  3. Uncontrolled type 2 diabetes with hyperglycemia. Continue sliding scale insulin. Add a long-acting insulin.  4. New  diagnosis of hypothyroidism. Start Synthroid.       DVT prophylaxis: Heparin drip Code Status: Full Family Communication: None Disposition Plan:  .   Status is: Observation  The patient will require care spanning > 2 midnights and should be moved to inpatient because: Inpatient level of care appropriate due to severity of illness  Dispo: The patient is from: Home              Anticipated d/c is to: Home              Anticipated d/c date is: 1 day              Patient currently is not medically stable to d/c.        No intake/output data recorded. Total I/O In: -  Out: 8119 [Urine:1150]     Consultants:   cardiology  Procedures: None  Antimicrobials:None  Subjective: Patient feels much better today. Short of breath improving, no cough. Denies any chest pain or palpitation. No fever chills per No abdominal pain or nausea vomiting. No dysuria or hematuria.  Objective: Vitals:   11/16/20 1100 11/16/20 1200 11/16/20 1230 11/16/20 1300  BP: (!) 153/98 109/80 121/90 114/68  Pulse: (!) 56 69 74 86  Resp: (!) 22 20 (!) 22 (!) 23  Temp:      SpO2: 97% 93% 94% 92%  Weight:      Height:        Intake/Output Summary (Last 24 hours) at 11/16/2020 1348 Last data filed at 11/16/2020 1319 Gross per 24 hour  Intake --  Output 1150 ml  Net -1150 ml   Filed Weights   11/15/20  2157  Weight: 122.5 kg    Examination:  General exam: Appears calm and comfortable  Respiratory system: Clear to auscultation. Respiratory effort normal. Cardiovascular system: Irregularly irregular, tachycardic. No JVD, murmurs, rubs, gallops or clicks. 1+ pedal edema. Gastrointestinal system: Abdomen is nondistended, soft and nontender. No organomegaly or masses felt. Normal bowel sounds heard. Central nervous system: Alert and oriented. No focal neurological deficits. Extremities: Symmetric 5 x 5 power. Skin: No rashes, lesions or ulcers Psychiatry: Judgement and insight appear  normal. Mood & affect appropriate.     Data Reviewed: I have personally reviewed following labs and imaging studies  CBC: Recent Labs  Lab 11/15/20 2155 11/16/20 0641  WBC 13.3* 8.9  NEUTROABS 10.7*  --   HGB 16.2 14.0  HCT 50.5 44.6  MCV 82.7 83.2  PLT 280 601   Basic Metabolic Panel: Recent Labs  Lab 11/15/20 2155 11/16/20 0641  NA 135 137  K 3.9 4.2  CL 100 105  CO2 21* 22  GLUCOSE 282* 244*  BUN 41* 36*  CREATININE 2.07* 1.79*  CALCIUM 9.2 8.4*  MG  --  2.1   GFR: Estimated Creatinine Clearance: 54.1 mL/min (A) (by C-G formula based on SCr of 1.79 mg/dL (H)). Liver Function Tests: No results for input(s): AST, ALT, ALKPHOS, BILITOT, PROT, ALBUMIN in the last 168 hours. No results for input(s): LIPASE, AMYLASE in the last 168 hours. No results for input(s): AMMONIA in the last 168 hours. Coagulation Profile: Recent Labs  Lab 11/16/20 1303  INR 1.0   Cardiac Enzymes: No results for input(s): CKTOTAL, CKMB, CKMBINDEX, TROPONINI in the last 168 hours. BNP (last 3 results) No results for input(s): PROBNP in the last 8760 hours. HbA1C: Recent Labs    11/16/20 0641  HGBA1C 10.3*   CBG: Recent Labs  Lab 11/16/20 0042 11/16/20 0747 11/16/20 1136  GLUCAP 254* 213* 320*   Lipid Profile: Recent Labs    11/16/20 0641  CHOL 148  HDL 23*  LDLCALC 95  TRIG 152*  CHOLHDL 6.4   Thyroid Function Tests: Recent Labs    11/16/20 0641  TSH 10.115*   Anemia Panel: No results for input(s): VITAMINB12, FOLATE, FERRITIN, TIBC, IRON, RETICCTPCT in the last 72 hours. Sepsis Labs: Recent Labs  Lab 11/16/20 0641  PROCALCITON 0.11    Recent Results (from the past 240 hour(s))  Resp Panel by RT-PCR (Flu A&B, Covid) Nasopharyngeal Swab     Status: None   Collection Time: 11/15/20 10:55 PM   Specimen: Nasopharyngeal Swab; Nasopharyngeal(NP) swabs in vial transport medium  Result Value Ref Range Status   SARS Coronavirus 2 by RT PCR NEGATIVE NEGATIVE Final     Comment: (NOTE) SARS-CoV-2 target nucleic acids are NOT DETECTED.  The SARS-CoV-2 RNA is generally detectable in upper respiratory specimens during the acute phase of infection. The lowest concentration of SARS-CoV-2 viral copies this assay can detect is 138 copies/mL. A negative result does not preclude SARS-Cov-2 infection and should not be used as the sole basis for treatment or other patient management decisions. A negative result may occur with  improper specimen collection/handling, submission of specimen other than nasopharyngeal swab, presence of viral mutation(s) within the areas targeted by this assay, and inadequate number of viral copies(<138 copies/mL). A negative result must be combined with clinical observations, patient history, and epidemiological information. The expected result is Negative.  Fact Sheet for Patients:  EntrepreneurPulse.com.au  Fact Sheet for Healthcare Providers:  IncredibleEmployment.be  This test is no t yet approved or cleared  by the [place] and  has been authorized for detection and/or diagnosis of SARS-CoV-2 by FDA under an Emergency Use Authorization (EUA). This EUA will remain  in effect (meaning this test can be used) for the duration of the COVID-19 declaration under Section 564(b)(1) of the Act, 21 U.S.C.section 360bbb-3(b)(1), unless the authorization is terminated  or revoked sooner.       Influenza A by PCR NEGATIVE NEGATIVE Final   Influenza B by PCR NEGATIVE NEGATIVE Final    Comment: (NOTE) The Xpert Xpress SARS-CoV-2/FLU/RSV plus assay is intended as an aid in the diagnosis of influenza from Nasopharyngeal swab specimens and should not be used as a sole basis for treatment. Nasal washings and aspirates are unacceptable for Xpert Xpress SARS-CoV-2/FLU/RSV testing.  Fact Sheet for Patients: EntrepreneurPulse.com.au  Fact Sheet for Healthcare  Providers: IncredibleEmployment.be  This test is not yet approved or cleared by the [place] FDA and has been authorized for detection and/or diagnosis of SARS-CoV-2 by FDA under an Emergency Use Authorization (EUA). This EUA will remain in effect (meaning this test can be used) for the duration of the COVID-19 declaration under Section 564(b)(1) of the Act, 21 U.S.C. section 360bbb-3(b)(1), unless the authorization is terminated or revoked.  Performed at [facility], [address], [place], [place] [address]          Radiology Studies: DG Chest Portable 1 View  Result Date: 11/15/2020 CLINICAL DATA:  Chest pain EXAM: PORTABLE CHEST 1 VIEW COMPARISON:  None. FINDINGS: No focal airspace disease or pleural effusion. Borderline to mild cardiomegaly. No pneumothorax. IMPRESSION: No active disease. Borderline to mild cardiomegaly. Electronically Signed   By: Donavan Foil M.D.   On: 11/15/2020 22:24        Scheduled Meds: . aspirin EC  81 mg Oral Daily  . atorvastatin  40 mg Oral Daily  . furosemide  20 mg Intravenous Q12H  . insulin aspart  0-15 Units Subcutaneous TID WC  . insulin aspart  0-5 Units Subcutaneous QHS  . metoprolol tartrate  50 mg Oral BID   Continuous Infusions: . heparin 1,300 Units/hr (11/16/20 1317)     LOS: 0 days    Time spent: 27 minutes    Sharen Hones, MD [facility]   To contact the attending provider between 7A-7P or the covering provider during after hours 7P-7A, please log into the web site [email or web address] and access using universal [place] password for that web site. If you do not have the password, please call the hospital operator.  11/16/2020, 1:48 PM

## 2020-11-16 NOTE — ED Notes (Addendum)
Echo at bedside

## 2020-11-16 NOTE — Consult Note (Signed)
Cardiology Consultation:   Patient ID: Joshua Landry; [id number]; [birth year]   Admit date: 11/15/2020 Date of Consult: 11/16/2020  Primary Care Provider: Patient, No Pcp Per Primary Cardiologist: Former [place] consult by End Primary Electrophysiologist:  None   Patient Profile:   Joshua Landry is a 64 y.o. male with a hx of CAD status post PCI x2 to the RCA in 2011, permanent A. fib, HFpEF, pulmonary hypertension, CKD stage III, DM2, prostate cancer, HTN, HLD, morbid obesity, and OSA not on CPAP who is being seen today for the evaluation of permanent A. fib with RVR and elevated troponin at the request of Joshua Landry.  History of Present Illness:   Joshua Landry was admitted to the hospital in 01/2010 with an NSTEMI and underwent PCI/BMS to the proximal RCA.  He underwent repeat cath in 05/2010 and was found to have significant ISR within the RCA stent and underwent PCI/DES at that time.  He was admitted in 11/2010 with another NSTEMI with subsequent cath demonstrating a patent RCA stent with a possible thrombus in the PL branch and otherwise nonobstructive disease.  Most recent cath from 11/2014 showed severe small diagonal branch mid stenosis with mild ISR of the RCA stent with medical management advised.  Most recent echo from 2018 showed an EF of 55 to 60%, mildly increased LV septal wall thickness with concentric LVH, mildly dilated RV, mild to moderate right atrium dilatation, mildly dilated left atrium measuring 45 mm, mild dilatation of the ascending aorta measuring 3.6 cm.  Review of EKG reports in care everywhere shows his last documented time in sinus rhythm was 2012.  He indicates his former cardiologist had previously discussed rhythm control strategy though due to his multiple comorbid conditions rate control strategy was decided upon.  He has been lost to follow-up from his primary cardiology group in [place], [place] since 07/2019.  He recently changed jobs and was  unable to afford insurance during this transition therefore he indicates he has been out of his medications for the past 4 weeks outside of taking a baby aspirin daily.  Over the past week he has noted increased shortness of breath and bilateral lower extremity swelling.  With this, he has noted some increased shortness of breath.  He denies any abdominal distention or worsening orthopnea from his baseline 2 pillows.  While sitting in his car on the evening of 12/27 while preparing to go to work he developed onset of substernal chest discomfort radiating to the left shoulder with left arm numbness.  Symptoms were similar to his prior MI,, though not as intense.  He ended up taking 2 sublingual nitroglycerin with some improvement though not resolution of pain.  He denied any nausea, vomiting, diaphoresis, palpitations, dizziness, presyncope, or syncope.  Due to ongoing symptoms he presented to [facility] ED last evening where he was noted to be in A. fib with RVR with ventricular rates in the 160s bpm.  Was hypertensive with BP of 155/129.  He was satting at 98% on room air.  Labs were notable for an initial high-sensitivity troponin of 83 with a delta troponin downtrending to 65.  Covid and influenza negative.  BNP 244.  Potassium 3.9-->4.2, BUN 41-->36, serum creatinine 2.07-->1.79 with a baseline around approximately 1.8.  PCT 0.11.  A1c 10.3.  Chest x-ray showed borderline to mild cardiomegaly with no active disease.  In the ED he was given ASA 324 mg x 1 along with IV diltiazem 15 mg with  improvement in ventricular rates to the low 100s to 1 teens bpm and noted symptomatic improvement in his chest pressure and dyspnea.  This morning he has been placed on heparin infusion, p.o. metoprolol, and IV Lasix.  He notes some improvement in his chest discomfort and dyspnea though continues to feel some bilateral pedal edema.  He denies any palpitations at this time.   Past Medical History:  Diagnosis Date  . (HFpEF) heart  failure with preserved ejection fraction ([place])   . CAD (coronary artery disease)   . Chronic kidney disease (CKD), stage III (moderate) (HCC)   . Diabetes mellitus without complication ([place])   . Essential hypertension   . Hyperlipidemia   . Morbid obesity ([place])   . OSA (obstructive sleep apnea)   . Permanent atrial fibrillation ([place])   . Prostate CA [facility])     History reviewed. No pertinent surgical history.   Home Meds: Prior to Admission medications   Medication Sig Start Date End Date Taking? Authorizing Provider  apixaban (ELIQUIS) 5 MG TABS tablet Take 5 mg by mouth 2 (two) times daily. 01/31/18  Yes [provider]  aspirin 81 MG EC tablet Take 81 mg by mouth daily.   Yes [provider]  atorvastatin (LIPITOR) 80 MG tablet Take 80 mg by mouth at bedtime. 12/25/14  Yes [provider]  carvedilol (COREG) 25 MG tablet Take 25 mg by mouth 2 (two) times daily with a meal. 01/13/16  Yes [provider]  Cholecalciferol (VITAMIN D3) 10 MCG (400 UNIT) CAPS Take 400 Units by mouth daily.   Yes [provider]  empagliflozin (JARDIANCE) 25 MG TABS tablet Take 25 mg by mouth daily. 03/12/20  Yes [provider]  furosemide (LASIX) 20 MG tablet Take 20 mg by mouth See admin instructions. Take 20 mg every day and 1 tablet if you have a 5 lb weight gain in 1 day. 03/26/17  Yes [provider]  isosorbide mononitrate (IMDUR) 30 MG 24 hr tablet Take 30 mg by mouth daily. 05/28/20  Yes [provider]  lisinopril (ZESTRIL) 10 MG tablet Take 10 mg by mouth daily. 07/03/19  Yes [provider]  metFORMIN (GLUCOPHAGE) 1000 MG tablet Take 1,000 mg by mouth 2 (two) times daily. 06/30/20  Yes [provider]  nitroGLYCERIN (NITROSTAT) 0.4 MG SL tablet Place 0.4 mg under the tongue See admin instructions. Place 1 tablet under the tongue every 5 minutes as needed for chest pain. Max of 3 doses in 15 minutes. 12/25/14  Yes [provider]  potassium chloride SA (KLOR-CON) 20 MEQ tablet Take 20 mEq by mouth daily. 01/13/16  Yes [provider]  tamsulosin (FLOMAX) 0.4 MG CAPS capsule Take 0.4 mg by mouth daily. 09/05/17  Yes [provider]    Inpatient Medications: Scheduled Meds: . aspirin EC  81 mg Oral Daily  . atorvastatin  40 mg Oral Daily  . furosemide  20 mg Intravenous Q12H  . insulin aspart  0-15 Units Subcutaneous TID WC  . insulin aspart  0-5 Units Subcutaneous QHS  . metoprolol tartrate  50 mg Oral BID   Continuous Infusions:  PRN Meds: acetaminophen, nitroGLYCERIN, ondansetron (ZOFRAN) IV  Allergies:  No Known Allergies  Social History:   Social History   Socioeconomic History  . Marital status: Single    Spouse name: Not on file  . Number of children: Not on file  . Years of education: Not on file  . Highest education level:  Not on file  Occupational History  . Not on file  Tobacco Use  . Smoking status: Never Smoker  . Smokeless tobacco: Not on file  Vaping Use  . Vaping Use: Never used  Substance and Sexual Activity  . Alcohol use: Yes    Comment: social  . Drug use: Never  . Sexual activity: Not Currently  Other Topics Concern  . Not on file  Social History Narrative  . Not on file   Social Determinants of Health   Financial Resource Strain: Not on file  Food Insecurity: Not on file  Transportation Needs: Not on file  Physical Activity: Not on file  Stress: Not on file  Social Connections: Not on file  Intimate Partner Violence: Not on file     Family History:   Family History  Problem Relation Age of Onset  . COPD Mother     ROS:  Review of Systems  Constitutional: Positive for malaise/fatigue. Negative for chills, diaphoresis, fever and weight loss.  HENT: Negative for congestion.   Eyes: Negative for discharge and redness.  Respiratory: Positive for shortness of breath. Negative for cough, sputum production and wheezing.    Cardiovascular: Positive for chest pain and leg swelling. Negative for palpitations, orthopnea, claudication and PND.  Gastrointestinal: Negative for abdominal pain, blood in stool, heartburn, melena, nausea and vomiting.  Musculoskeletal: Negative for falls and myalgias.  Skin: Negative for rash.  Neurological: Positive for sensory change. Negative for dizziness, tingling, tremors, speech change, focal weakness, loss of consciousness and weakness.  Endo/Heme/Allergies: Does not bruise/bleed easily.  Psychiatric/Behavioral: Negative for substance abuse. The patient is not nervous/anxious.   All other systems reviewed and are negative.     Physical Exam/Data:   Vitals:   11/16/20 0630 11/16/20 0700 11/16/20 0800 11/16/20 0900  BP: (!) 137/117   123/87  Pulse: 73 97 (!) 56 (!) 25  Resp: (!) 21 (!) 25 17 20   Temp:      SpO2: 96% 99% 90% 97%  Weight:      Height:       No intake or output data in the 24 hours ending 11/16/20 1039 Filed Weights   11/15/20 2157  Weight: 122.5 kg   Body mass index is 37.66 kg/m.   Physical Exam: General: Well developed, well nourished, in no acute distress. Head: Normocephalic, atraumatic, sclera non-icteric, no xanthomas, nares without discharge.  Neck: Negative for carotid bruits. JVD difficult to assess secondary to body habitus. Lungs: Diminished breath sounds bilaterally and diffusely with poor inspiratory effort noted. Breathing is unlabored. Heart: Tachycardic, irregularly irregular with S1 S2. No murmurs, rubs, or gallops appreciated. Abdomen: Soft, non-tender, non-distended with normoactive bowel sounds. No hepatomegaly. No rebound/guarding. No obvious abdominal masses. Msk:  Strength and tone appear normal for age. Extremities: No clubbing or cyanosis.  Trace pedal edema. Distal pedal pulses are 2+ and equal bilaterally. Neuro: Alert and oriented X 3. No facial asymmetry. No focal deficit. Moves all extremities spontaneously. Psych:   Responds to questions appropriately with a normal affect.   EKG:  The EKG was personally reviewed and demonstrates: A. fib with RVR, 63 bpm, baseline artifact, poor R wave progression along the precordial leads, nonspecific ST-T changes Telemetry:  Telemetry was personally reviewed and demonstrates: A. fib with RVR with ventricular rates in the 140s to 150s initially improved to the low 100s to 1 teens bpm currently  Weights: Filed Weights   11/15/20 2157  Weight: 122.5 kg    Relevant CV Studies:  2D echo 10/23/2017 [facility]): Summary:  1. Left ventricle septal thickness is mildly increased. --with  concentric LVH  2. The EF is estimated at 55-60%.  3. Abnormal diastolic filling pattern consistent with underlying atrial  fibrillation was observed. --with heart rate 66 to 94 bpm  4. The right ventricular cavity size is mildly enlarged.  5. The right atrium is mild to moderately dilated.   6. The inferior vena cava is not visualized.  7. There is mild dilatation of the ascending aorta. --at 3.6 cm   Recommendation:  1. Consider non compilance of OSA therapy contributing as an etiology  for recent fluid retention symptoms __________  [facility] 12/22/2014 [facility]): PRESSURES:  RA Pressure: 31 mean mmHG  RV Pressure: 62 mmHG   RVEDP:  32 mmHG  PA Pressure: 64/48 mmHG   Mean PA pressure: 53 mmHG  PCWP: Mean 44 mmHG no V wave  AO pressure: 125/101 mmHG   Mean AO pressure:  503 mmHG  LV systolic pressure: 22 mmHG   LVEDP: 40 mmHG   OXYGEN SATURATIONS:  RA: 49 %  PA:  49 %  Femoral:  94 %  Other: N/A   CARDIAC OUTPUT:  Thermodilution cardiac output:  4.4 L/min  Thermodilution cardiac index:  1.9 L/min/m2  Fickcardiac output:  3.2 L/min  Fick cardiac index:  1.3 L/min/m2    CORONARY ANATOMY:  Left Main: This vessel bifurcated into LAD, small ramus branch, and  moderately sized left circumflex vessel. The left main artery had no  stenosis    Left anterior descending artery: This vessel gave rise to a long 2 mm  small-to medium-sized diagonal branch. The diagonal branch had a 99% mid  stenosis that was not the culprit of the patient's profound  cardiomyopathy. The proximal LAD was patent. The mid LAD had a  moderately long 30-50% stenosis.   Left circumflex artery: This vessel and its large mid obtuse marginal  branch and medium-sized distal obtuse marginal branch was free of disease.   Right coronary artery: This vessel gave rise to a long proximal vessel  stent with mild in-stent restenosis. The remainder of the RCA and its  medium-sized PDA and small-to medium-sized posterior lateral branch were  free of disease.    LEFT VENTRICULOGRAM: This was performed by hand injection to spare dye.   There is underfilling of the ventricle showing severe LV chamber  enlargement with severe global hypokinesis and left ventricular ejection  fraction less than 20%. There was inability to use assess for mitral  regurgitation or aortic size.   CONCLUSIONS:  1. Severe small vessel disease of a single branch otherwise mild LAD  disease and very mild in-stent restenosis of the right coronary artery;  the cardiomyopathy is out of proportion to severity of coronary disease  2. Severe LV systolic dysfunction  3. Profound volume overload by right heart catheterization with marginal  cardiac index    PLAN:   1. IV Lasix diuresis  2. Continue to advance carvedilol and ACE inhibitor therapy  __________    Laboratory Data:  Chemistry Recent Labs  Lab 11/15/20 2155 11/16/20 0641  NA 135 137  K 3.9 4.2  CL 100 105  CO2 21* 22  GLUCOSE 282* 244*  BUN 41* 36*  CREATININE 2.07* 1.79*  CALCIUM 9.2 8.4*  GFRNONAA 35* 41*  ANIONGAP 14 10    No results for input(s): PROT, ALBUMIN, AST, ALT, ALKPHOS, BILITOT in the last 168 hours. Hematology Recent Labs  Lab 11/15/20 2155 11/16/20  0641  WBC 13.3* 8.9  RBC  6.11* 5.36  HGB 16.2 14.0  HCT 50.5 44.6  MCV 82.7 83.2  MCH 26.5 26.1  MCHC 32.1 31.4  RDW 17.8* 16.7*  PLT 280 225   Cardiac EnzymesNo results for input(s): TROPONINI in the last 168 hours. No results for input(s): TROPIPOC in the last 168 hours.  BNP Recent Labs  Lab 11/16/20 0641  BNP 244.1*    DDimer No results for input(s): DDIMER in the last 168 hours.  Radiology/Studies:  DG Chest Portable 1 View  Result Date: 11/15/2020 IMPRESSION: No active disease. Borderline to mild cardiomegaly. Electronically Signed   By: Donavan Foil M.D.   On: 11/15/2020 22:24    Assessment and Plan:   1.  CAD involving the native coronary arteries with demand ischemia: -Currently notes some improvement in symptoms with improved rate control -High-sensitivity troponin minimally elevated at 83 with a delta of 65, not consistent with ACS and likely present in the setting of demand ischemia secondary to volume overload and A. fib with RVR along with acute on chronic CKD -Check echo, if this demonstrates preserved LVSF we will plan for Lexiscan MPI on 11/17/2020 -If he is found to have a new cardiomyopathy would proceed with diagnostic R/LHC prior to discharge -Heparin for now as outlined below -ASA  2.  Presumed permanent A. fib: -Last EKG demonstrating sinus rhythm per interpretation in care everywhere is dated 2012 -He indicates his primary cardiologist previously discussed rhythm control strategy though elected to pursue rate control -Check echo to evaluate left atrial size with last echo in 2018 showing a left atrial dimension of 45 mm -Metoprolol for rate control, titrate as needed and able -CHADS2VASc at least 5 (CHF, HTN, age x 1, DM, vascular disease) -Start heparin drip for now until it is clear he will not require inpatient invasive procedures -Recommend transitioning to [place] prior to discharge -Check TSH and magnesium -Potassium at goal -With noncompliance/intolerance to CPAP,  history of medical nonadherence, ongoing morbid obesity, and presumed duration of being in A. fib, rhythm control strategy may be difficult to pursue  3. Acute on chronic HFpEF/pulmonary hypertension: -Likely exacerbated by medication/CPAP nonadherence and A. fib with RVR -IV Lasix 20 mg twice daily -Echo pending with further recommendations regarding medical therapy pending these results -Metoprolol -CHF education -Daily weights -Strict I's and O's  4. OSA: -Not on CPAP at home secondary to intolerance and noncompliance -Recommend rechallenging during the inpatient setting along with referral to pulmonology as an outpatient to discuss possible alternatives  5. HLD: -LDL of 95 this admission while off statin therapy -Goal LDL less than 70 -Resume atorvastatin -Follow-up fasting lipid panel and liver function in approximately 8 weeks with recommendation to escalate therapy as indicated to achieve goal LDL of less than 70  6.  Acute on CKD stage III: -Likely exacerbated by congestion -Monitor with diuresis  7.  Uncontrolled diabetes: -Likely in the setting of being out of medications for the past month -A1c 10.3 -Management per IM  8.  Morbid obesity: -Weight loss advised   For questions or updates, please contact [place] Please consult [email or web address] for contact info under Cardiology/STEMI.   Signed, Christell Faith, PA-C [place] Pager: [phone] 11/16/2020, 10:39 AM

## 2020-11-17 ENCOUNTER — Inpatient Hospital Stay (HOSPITAL_COMMUNITY): Payer: Commercial Managed Care - PPO

## 2020-11-17 DIAGNOSIS — I5043 Acute on chronic combined systolic (congestive) and diastolic (congestive) heart failure: Secondary | ICD-10-CM

## 2020-11-17 DIAGNOSIS — I4891 Unspecified atrial fibrillation: Secondary | ICD-10-CM | POA: Diagnosis not present

## 2020-11-17 DIAGNOSIS — R079 Chest pain, unspecified: Secondary | ICD-10-CM | POA: Diagnosis not present

## 2020-11-17 DIAGNOSIS — R072 Precordial pain: Secondary | ICD-10-CM

## 2020-11-17 DIAGNOSIS — I482 Chronic atrial fibrillation, unspecified: Secondary | ICD-10-CM

## 2020-11-17 DIAGNOSIS — I5020 Unspecified systolic (congestive) heart failure: Secondary | ICD-10-CM

## 2020-11-17 LAB — BASIC METABOLIC PANEL
Anion gap: 9 (ref 5–15)
BUN: 34 mg/dL — ABNORMAL HIGH (ref 8–23)
CO2: 21 mmol/L — ABNORMAL LOW (ref 22–32)
Calcium: 8.6 mg/dL — ABNORMAL LOW (ref 8.9–10.3)
Chloride: 103 mmol/L (ref 98–111)
Creatinine, Ser: 1.94 mg/dL — ABNORMAL HIGH (ref 0.61–1.24)
GFR, Estimated: 37 mL/min — ABNORMAL LOW (ref >60)
Glucose, Bld: 217 mg/dL — ABNORMAL HIGH (ref 70–99)
Potassium: 4.1 mmol/L (ref 3.5–5.1)
Sodium: 133 mmol/L — ABNORMAL LOW (ref 135–145)

## 2020-11-17 LAB — T4, FREE: Free T4: 0.93 ng/dL (ref 0.61–1.12)

## 2020-11-17 LAB — NM MYOCAR MULTI W/SPECT W/WALL MOTION / EF
LV dias vol: 135 mL (ref 62–150)
LV sys vol: 90 mL
Peak HR: 88 {beats}/min
Percent HR: 57 %
Rest HR: 77 {beats}/min
SDS: 0
SRS: 3
SSS: 2
TID: 0.93

## 2020-11-17 LAB — CBC WITH DIFFERENTIAL/PLATELET
Abs Immature Granulocytes: 0.13 10*3/uL — ABNORMAL HIGH (ref 0.00–0.07)
Basophils Absolute: 0.1 10*3/uL (ref 0.0–0.1)
Basophils Relative: 1 %
Eosinophils Absolute: 0.4 10*3/uL (ref 0.0–0.5)
Eosinophils Relative: 4 %
HCT: 44.3 % (ref 39.0–52.0)
Hemoglobin: 14.3 g/dL (ref 13.0–17.0)
Immature Granulocytes: 1 %
Lymphocytes Relative: 8 %
Lymphs Abs: 0.9 10*3/uL (ref 0.7–4.0)
MCH: 26.9 pg (ref 26.0–34.0)
MCHC: 32.3 g/dL (ref 30.0–36.0)
MCV: 83.3 fL (ref 80.0–100.0)
Monocytes Absolute: 1.2 10*3/uL — ABNORMAL HIGH (ref 0.1–1.0)
Monocytes Relative: 11 %
Neutro Abs: 8.2 10*3/uL — ABNORMAL HIGH (ref 1.7–7.7)
Neutrophils Relative %: 75 %
Platelets: 232 10*3/uL (ref 150–400)
RBC: 5.32 MIL/uL (ref 4.22–5.81)
RDW: 17 % — ABNORMAL HIGH (ref 11.5–15.5)
WBC: 10.9 10*3/uL — ABNORMAL HIGH (ref 4.0–10.5)
nRBC: 0 % (ref 0.0–0.2)

## 2020-11-17 LAB — GLUCOSE, CAPILLARY
Glucose-Capillary: 168 mg/dL — ABNORMAL HIGH (ref 70–99)
Glucose-Capillary: 133 mg/dL — ABNORMAL HIGH (ref 70–99)
Glucose-Capillary: 180 mg/dL — ABNORMAL HIGH (ref 70–99)
Glucose-Capillary: 206 mg/dL — ABNORMAL HIGH (ref 70–99)

## 2020-11-17 LAB — MAGNESIUM: Magnesium: 2.1 mg/dL (ref 1.7–2.4)

## 2020-11-17 LAB — HIV ANTIBODY (ROUTINE TESTING W REFLEX): HIV Screen 4th Generation wRfx: NONREACTIVE

## 2020-11-17 LAB — HEPARIN LEVEL (UNFRACTIONATED)
Heparin Unfractionated: 0.24 IU/mL — ABNORMAL LOW (ref 0.30–0.70)
Heparin Unfractionated: 0.12 IU/mL — ABNORMAL LOW (ref 0.30–0.70)
Heparin Unfractionated: 0.53 IU/mL (ref 0.30–0.70)

## 2020-11-17 MED ORDER — HEPARIN (PORCINE) 25000 UT/250ML-% IV SOLN
1800.0000 [IU]/h | INTRAVENOUS | Status: DC
  Administered 2020-11-17: 1500 [IU]/h via INTRAVENOUS
  Administered 2020-11-17: 1800 [IU]/h via INTRAVENOUS
  Filled 2020-11-17 (×2): qty 250

## 2020-11-17 MED ORDER — LOSARTAN POTASSIUM 25 MG PO TABS
12.5000 mg | ORAL_TABLET | Freq: Every day | ORAL | Status: DC
  Administered 2020-11-17 – 2020-11-18 (×2): 12.5 mg via ORAL
  Filled 2020-11-17 (×2): qty 1

## 2020-11-17 MED ORDER — TECHNETIUM TC 99M TETROFOSMIN IV KIT
10.0000 | PACK | Freq: Once | INTRAVENOUS | Status: AC | PRN
  Administered 2020-11-17: 10.87 via INTRAVENOUS

## 2020-11-17 MED ORDER — HEPARIN BOLUS VIA INFUSION
1500.0000 [IU] | Freq: Once | INTRAVENOUS | Status: AC
  Administered 2020-11-17: 1500 [IU] via INTRAVENOUS
  Filled 2020-11-17: qty 1500

## 2020-11-17 MED ORDER — HEPARIN BOLUS VIA INFUSION
3000.0000 [IU] | Freq: Once | INTRAVENOUS | Status: AC
  Administered 2020-11-17: 3000 [IU] via INTRAVENOUS
  Filled 2020-11-17: qty 3000

## 2020-11-17 MED ORDER — FUROSEMIDE 10 MG/ML IJ SOLN: 20.0000 mg | Freq: Every day | INTRAMUSCULAR | Status: DC

## 2020-11-17 MED ORDER — FUROSEMIDE 20 MG PO TABS: 20.0000 mg | ORAL_TABLET | Freq: Every day | ORAL | Status: DC

## 2020-11-17 MED ORDER — REGADENOSON 0.4 MG/5ML IV SOLN
0.4000 mg | Freq: Once | INTRAVENOUS | Status: AC
  Administered 2020-11-17: 0.4 mg via INTRAVENOUS

## 2020-11-17 MED ORDER — TECHNETIUM TC 99M TETROFOSMIN IV KIT
30.0000 | PACK | Freq: Once | INTRAVENOUS | Status: AC | PRN
  Administered 2020-11-17: 30.34 via INTRAVENOUS

## 2020-11-17 NOTE — Progress Notes (Signed)
CCMD called to report 11 beat run of vtach/pt asymptomatic, VSS/ Dr.Duncan made aware, per MD magnesium and potassium WNL per lab results, will  continue to monitor for now.

## 2020-11-17 NOTE — Progress Notes (Addendum)
ANTICOAGULATION CONSULT NOTE  Pharmacy Consult for heparin Indication: ACS/STEMI/afib  No Known Allergies  Patient Measurements: Height: 5\' 11"  (180.3 cm) Weight: 123.2 kg (271 lb 9.7 oz) IBW/kg (Calculated) : 75.3 Heparin Dosing Weight: 102 kg  Vital Signs: Temp: 97.8 F (36.6 C) (12/29 1210) Temp Source: Oral (12/29 1210) BP: 101/70 (12/29 1210) Pulse Rate: 46 (12/29 1210)  Labs: Recent Labs    11/15/20 2155 11/16/20 0641 11/16/20 1303 11/16/20 1303 11/16/20 1904 11/17/20 0248 11/17/20 1208  HGB 16.2 14.0  --   --   --  14.3  --   HCT 50.5 44.6  --   --   --  44.3  --   PLT 280 225  --   --   --  232  --   APTT  --   --  28  --   --   --   --   LABPROT  --   --  12.9  --   --   --   --   INR  --   --  1.0  --   --   --   --   HEPARINUNFRC  --   --  <0.10*   < > 0.36 0.24* 0.12*  CREATININE 2.07* 1.79*  --   --   --  1.94*  --   TROPONINIHS 83* 65*  --   --   --   --   --    < > = values in this interval not displayed.    Estimated Creatinine Clearance: 50.1 mL/min (A) (by C-G formula based on SCr of 1.94 mg/dL (H)).   Medical History: Past Medical History:  Diagnosis Date  . (HFpEF) heart failure with preserved ejection fraction ([place])   . CAD (coronary artery disease)   . Chronic kidney disease (CKD), stage III (moderate) (HCC)   . Diabetes mellitus without complication ([place])   . Essential hypertension   . Hyperlipidemia   . Morbid obesity ([place])   . OSA (obstructive sleep apnea)   . Permanent atrial fibrillation ([place])   . Prostate CA [facility])      Assessment: 64 year old male presented with SOB. Patient with h/o afib previously on Eliquis PTA. Patient unable to take medications for approximately one month because of insurance issues with starting new job. Pharmacy consulted for heparin drip pending r/o for possible invasive procedures. CHADSVASc 4.   Goal of Therapy:  Heparin level 0.3-0.7 units/ml Monitor platelets by anticoagulation protocol: Yes    Date    Time    HL       Rate  12/28   1904    0.36       1300units/hr ; thera x1 12/29 0248 0.24   Bolus 1500 units and increase rate to 1500 units/hr  12/29 1208 0.12 - no issue with infusion or lines per Lilia Pro (pharmacy resident, who spoke with RN).   Plan:  Heparin level is subtherapeutic. Will give bolus of 3000 units and increase heparin infusion to 1800 units/hr. Recheck heparin level in 6 hours. CBC daily while on heparin. Plan for stress test 12/29.   Eleonore Chiquito, PharmD, BCPS 11/17/2020 2:18 PM

## 2020-11-17 NOTE — Progress Notes (Signed)
PROGRESS NOTE    Joshua Landry  NTZ:[id number] DOB: 26-Aug-1956 DOA: 11/15/2020 PCP: Patient, No Pcp Per   Assessment & Plan:   Principal Problem:   Atrial fibrillation with rapid ventricular response ([place]) Active Problems:   Type 2 diabetes mellitus with stage 3 chronic kidney disease (HCC)   Stage 3b chronic kidney disease ([place])   Current use of long term anticoagulation   Dilated cardiomyopathy (HCC)   Essential hypertension   History of MI (myocardial infarction)   Elevated troponin   Chronic diastolic CHF (congestive heart failure) (HCC)   Chest pain   Rapid atrial fibrillation (HCC)   Acute on chronic diastolic (congestive) heart failure (HCC)  Chest pain: etiology unclear. Cardiac stress test today as per cardio. Hx of CAD s/p stents  Permanent atrial fibrillation: w/ RVR. Continue on metoprolol & IV heparin drip. Continue on tele. Cardio following and recs apprec  Acute on chronic combined CHF: continue on IV lasix. Monitor I/Os.   Leukocytosis: likely reactive.   AKI on CKDIIIb: Cr is labile. Avoid nephrotoxic meds  DM2: poorly controlled. Continue on lantus, SSI w/ accuchecks   Hypothyroidism: new onset. Continue on sythroid   DVT prophylaxis: heparin  Code Status: full  Family Communication: Disposition Plan: depends on PT/OT recs (not consulted yet)    Status is: Inpatient  Remains inpatient appropriate because:Ongoing diagnostic testing needed not appropriate for outpatient work up and Unsafe d/c plan   Dispo: The patient is from: Home              Anticipated d/c is to: Home              Anticipated d/c date is: 2 days              Patient currently is not medically stable to d/c.        Consultants:   Cardio    Procedures: cardiac stress test today    Antimicrobials:    Subjective: Pt c/o intermittent chest pain   Objective: Vitals:   11/16/20 1926 11/17/20 0500 11/17/20 0533 11/17/20 0800  BP: 110/70  (!) 109/99 118/82   Pulse: 70  86 89  Resp: 19  19   Temp: 97.7 F (36.5 C)  97.7 F (36.5 C)   TempSrc: Oral  Oral   SpO2: 96%  95% 100%  Weight:  123.2 kg    Height:        Intake/Output Summary (Last 24 hours) at 11/17/2020 0854 Last data filed at 11/17/2020 0500 Gross per 24 hour  Intake 714.35 ml  Output 1153 ml  Net -438.65 ml   Filed Weights   11/15/20 2157 11/16/20 1747 11/17/20 0500  Weight: 122.5 kg 121.7 kg 123.2 kg    Examination:  General exam: Appears calm and comfortable  Respiratory system: Clear to auscultation. Respiratory effort normal. Cardiovascular system: S1 & S2 + No rubs, gallops or clicks. Gastrointestinal system: Abdomen is nondistended, soft and nontender.Normal bowel sounds heard. Central nervous system: Alert and oriented. Moves all 4 extremities  Psychiatry: Judgement and insight appear normal. Mood & affect appropriate.     Data Reviewed: I have personally reviewed following labs and imaging studies  CBC: Recent Labs  Lab 11/15/20 2155 11/16/20 0641 11/17/20 0248  WBC 13.3* 8.9 10.9*  NEUTROABS 10.7*  --  8.2*  HGB 16.2 14.0 14.3  HCT 50.5 44.6 44.3  MCV 82.7 83.2 83.3  PLT 280 225 675   Basic Metabolic Panel: Recent Labs  Lab 11/15/20 2155  11/16/20 0641 11/17/20 0248  NA 135 137 133*  K 3.9 4.2 4.1  CL 100 105 103  CO2 21* 22 21*  GLUCOSE 282* 244* 217*  BUN 41* 36* 34*  CREATININE 2.07* 1.79* 1.94*  CALCIUM 9.2 8.4* 8.6*  MG  --  2.1 2.1   GFR: Estimated Creatinine Clearance: 50.1 mL/min (A) (by C-G formula based on SCr of 1.94 mg/dL (H)). Liver Function Tests: No results for input(s): AST, ALT, ALKPHOS, BILITOT, PROT, ALBUMIN in the last 168 hours. No results for input(s): LIPASE, AMYLASE in the last 168 hours. No results for input(s): AMMONIA in the last 168 hours. Coagulation Profile: Recent Labs  Lab 11/16/20 1303  INR 1.0   Cardiac Enzymes: No results for input(s): CKTOTAL, CKMB, CKMBINDEX, TROPONINI in the last 168  hours. BNP (last 3 results) No results for input(s): PROBNP in the last 8760 hours. HbA1C: Recent Labs    11/16/20 0641  HGBA1C 10.3*   CBG: Recent Labs  Lab 11/16/20 0747 11/16/20 1136 11/16/20 1710 11/16/20 2107 11/17/20 0802  GLUCAP 213* 320* 180* 251* 168*   Lipid Profile: Recent Labs    11/16/20 0641  CHOL 148  HDL 23*  LDLCALC 95  TRIG 152*  CHOLHDL 6.4   Thyroid Function Tests: Recent Labs    11/16/20 0641  TSH 10.115*   Anemia Panel: No results for input(s): VITAMINB12, FOLATE, FERRITIN, TIBC, IRON, RETICCTPCT in the last 72 hours. Sepsis Labs: Recent Labs  Lab 11/16/20 0641  PROCALCITON 0.11    Recent Results (from the past 240 hour(s))  Resp Panel by RT-PCR (Flu A&B, Covid) Nasopharyngeal Swab     Status: None   Collection Time: 11/15/20 10:55 PM   Specimen: Nasopharyngeal Swab; Nasopharyngeal(NP) swabs in vial transport medium  Result Value Ref Range Status   SARS Coronavirus 2 by RT PCR NEGATIVE NEGATIVE Final    Comment: (NOTE) SARS-CoV-2 target nucleic acids are NOT DETECTED.  The SARS-CoV-2 RNA is generally detectable in upper respiratory specimens during the acute phase of infection. The lowest concentration of SARS-CoV-2 viral copies this assay can detect is 138 copies/mL. A negative result does not preclude SARS-Cov-2 infection and should not be used as the sole basis for treatment or other patient management decisions. A negative result may occur with  improper specimen collection/handling, submission of specimen other than nasopharyngeal swab, presence of viral mutation(s) within the areas targeted by this assay, and inadequate number of viral copies(<138 copies/mL). A negative result must be combined with clinical observations, patient history, and epidemiological information. The expected result is Negative.  Fact Sheet for Patients:  EntrepreneurPulse.com.au  Fact Sheet for Healthcare Providers:   IncredibleEmployment.be  This test is no t yet approved or cleared by the [place] FDA and  has been authorized for detection and/or diagnosis of SARS-CoV-2 by FDA under an Emergency Use Authorization (EUA). This EUA will remain  in effect (meaning this test can be used) for the duration of the COVID-19 declaration under Section 564(b)(1) of the Act, 21 U.S.C.section 360bbb-3(b)(1), unless the authorization is terminated  or revoked sooner.       Influenza A by PCR NEGATIVE NEGATIVE Final   Influenza B by PCR NEGATIVE NEGATIVE Final    Comment: (NOTE) The Xpert Xpress SARS-CoV-2/FLU/RSV plus assay is intended as an aid in the diagnosis of influenza from Nasopharyngeal swab specimens and should not be used as a sole basis for treatment. Nasal washings and aspirates are unacceptable for Xpert Xpress SARS-CoV-2/FLU/RSV testing.  Fact Sheet  for Patients: EntrepreneurPulse.com.au  Fact Sheet for Healthcare Providers: IncredibleEmployment.be  This test is not yet approved or cleared by the [place] FDA and has been authorized for detection and/or diagnosis of SARS-CoV-2 by FDA under an Emergency Use Authorization (EUA). This EUA will remain in effect (meaning this test can be used) for the duration of the COVID-19 declaration under Section 564(b)(1) of the Act, 21 U.S.C. section 360bbb-3(b)(1), unless the authorization is terminated or revoked.  Performed at [facility], [address], [place], [place] [address]          Radiology Studies: DG Chest Portable 1 View  Result Date: 11/15/2020 CLINICAL DATA:  Chest pain EXAM: PORTABLE CHEST 1 VIEW COMPARISON:  None. FINDINGS: No focal airspace disease or pleural effusion. Borderline to mild cardiomegaly. No pneumothorax. IMPRESSION: No active disease. Borderline to mild cardiomegaly. Electronically Signed   By: Donavan Foil M.D.   On: 11/15/2020 22:24    ECHOCARDIOGRAM COMPLETE  Result Date: 11/16/2020    ECHOCARDIOGRAM REPORT   Patient Name:   Joshua Landry Date of Exam: 11/16/2020 Medical Rec #:  [id number]       Height:       71.0 in Accession #:    [id number]      Weight:       270.0 lb Date of Birth:  [birth year]       BSA:          2.395 m Patient Age:    64 years        BP:           114/68 mmHg Patient Gender: M               HR:           86 bpm. Exam Location:  [facility] Procedure: 2D Echo, Cardiac Doppler, Color Doppler and Intracardiac            Opacification Agent Indications:     Atrial Fibrillation 143.91  History:         Patient has no prior history of Echocardiogram examinations.                  Risk Factors:Hypertension and Diabetes. Permanent Afib.  Sonographer:     Sherrie Sport RDCS (AE) Referring Phys:  [id number] Athena Masse Diagnosing Phys: Nelva Bush MD  Sonographer Comments: Suboptimal apical window and no subcostal window. IMPRESSIONS  1. Left ventricular ejection fraction, by estimation, is 35 to 40%. The left ventricle has moderately decreased function. The left ventricle demonstrates global hypokinesis. There is mild left ventricular hypertrophy. Left ventricular diastolic parameters are indeterminate.  2. Right ventricular systolic function is mildly reduced. The right ventricular size is normal. Tricuspid regurgitation signal is inadequate for assessing PA pressure.  3. Left atrial size was moderately dilated.  4. Right atrial size was mild to moderately dilated.  5. The mitral valve is normal in structure. Trivial mitral valve regurgitation. No evidence of mitral stenosis.  6. The aortic valve is tricuspid. There is mild thickening of the aortic valve. Aortic valve regurgitation is trivial. Mild aortic valve sclerosis is present, with no evidence of aortic valve stenosis. FINDINGS  Left Ventricle: Left ventricular ejection fraction, by estimation, is 35 to 40%. The left ventricle has moderately decreased function. The left  ventricle demonstrates global hypokinesis. Definity contrast agent was given IV to delineate the left ventricular endocardial borders. The left ventricular internal cavity size was normal in size. There is mild left ventricular  hypertrophy. Left ventricular diastolic parameters are indeterminate. Right Ventricle: The right ventricular size is normal. No increase in right ventricular wall thickness. Right ventricular systolic function is mildly reduced. Tricuspid regurgitation signal is inadequate for assessing PA pressure. Left Atrium: Left atrial size was moderately dilated. Right Atrium: Right atrial size was mild to moderately dilated. Pericardium: The pericardium was not well visualized. Mitral Valve: The mitral valve is normal in structure. Trivial mitral valve regurgitation. No evidence of mitral valve stenosis. Tricuspid Valve: The tricuspid valve is not well visualized. Tricuspid valve regurgitation is trivial. Aortic Valve: The aortic valve is tricuspid. There is mild thickening of the aortic valve. Aortic valve regurgitation is trivial. Mild aortic valve sclerosis is present, with no evidence of aortic valve stenosis. Aortic valve mean gradient measures 1.0 mmHg. Aortic valve peak gradient measures 1.7 mmHg. Aortic valve area, by VTI measures 3.31 cm. Pulmonic Valve: The pulmonic valve was normal in structure. Pulmonic valve regurgitation is not visualized. No evidence of pulmonic stenosis. Aorta: The aortic root is normal in size and structure. Pulmonary Artery: The pulmonary artery is of normal size. Venous: The inferior vena cava was not well visualized. IAS/Shunts: The interatrial septum was not well visualized.  LEFT VENTRICLE PLAX 2D LVIDd:         4.90 cm LVIDs:         4.21 cm LV PW:         1.22 cm LV IVS:        1.28 cm LVOT diam:     2.20 cm LV SV:         36 LV SV Index:   15 LVOT Area:     3.80 cm  LV Volumes (MOD) LV vol d, MOD A2C: 88.4 ml LV vol d, MOD A4C: 134.0 ml LV vol s, MOD A2C:  51.4 ml LV vol s, MOD A4C: 68.0 ml LV SV MOD A2C:     37.0 ml LV SV MOD A4C:     134.0 ml LV SV MOD BP:      50.7 ml RIGHT VENTRICLE RV Basal diam:  3.87 cm RV S prime:     10.40 cm/s LEFT ATRIUM              Index       RIGHT ATRIUM           Index LA diam:        4.70 cm  1.96 cm/m  RA Area:     25.30 cm LA Vol (A2C):   142.0 ml 59.28 ml/m RA Volume:   86.20 ml  35.99 ml/m LA Vol (A4C):   87.9 ml  36.70 ml/m LA Biplane Vol: 122.0 ml 50.93 ml/m  AORTIC VALVE                   PULMONIC VALVE AV Area (Vmax):    2.69 cm    PV Vmax:        0.60 m/s AV Area (Vmean):   2.60 cm    PV Peak grad:   1.4 mmHg AV Area (VTI):     3.31 cm    RVOT Peak grad: 1 mmHg AV Vmax:           64.60 cm/s AV Vmean:          46.300 cm/s AV VTI:            0.108 m AV Peak Grad:      1.7 mmHg AV Mean Grad:  1.0 mmHg LVOT Vmax:         45.70 cm/s LVOT Vmean:        31.700 cm/s LVOT VTI:          0.094 m LVOT/AV VTI ratio: 0.87  AORTA Ao Root diam: 3.40 cm MITRAL VALVE MV Area (PHT): 5.02 cm    SHUNTS MV Decel Time: 151 msec    Systemic VTI:  0.09 m MV E velocity: 92.10 cm/s  Systemic Diam: 2.20 cm Nelva Bush MD Electronically signed by Nelva Bush MD Signature Date/Time: 11/16/2020/4:02:26 PM    Final         Scheduled Meds: . aspirin EC  81 mg Oral Daily  . atorvastatin  40 mg Oral Daily  . [START ON 11/18/2020] furosemide  20 mg Intravenous Daily  . insulin aspart  0-15 Units Subcutaneous TID WC  . insulin aspart  0-5 Units Subcutaneous QHS  . insulin glargine  16 Units Subcutaneous QHS  . levothyroxine  50 mcg Oral Q0600  . losartan  12.5 mg Oral Daily  . metoprolol tartrate  50 mg Oral BID   Continuous Infusions: . heparin 1,500 Units/hr (11/17/20 0446)     LOS: 1 day    Time spent: 31 mins     Wyvonnia Dusky, MD [facility] Pager [phone]  If 7PM-7AM, please contact night-coverage 11/17/2020, 8:54 AM

## 2020-11-17 NOTE — Progress Notes (Signed)
Progress Note  Patient Name: Joshua Landry Date of Encounter: 11/17/2020  Primary Cardiologist: Former [facility] consult by End  Subjective   No further chest pain. Still with some SOB and a dry cough. Lower extremity swelling is improved. Documented UOP 438 mL for the past 24 hours and admission. Weight 121.7-->123.2 kg. Renal function abnormal, though relatively stable and similar to his baseline. Echo showed an EF of 35-40%, global HK, mild LVH, mildly reduced RVSF with normal RV cavity size, moderately dilated left atrium measuring 47 mm, mildly to moderately dilated right atrium, trivial mitral regurgitation and aortic valve insufficiency, mild aortic sclerosis without stenosis.   Inpatient Medications    Scheduled Meds: . aspirin EC  81 mg Oral Daily  . atorvastatin  40 mg Oral Daily  . furosemide  20 mg Intravenous Q12H  . insulin aspart  0-15 Units Subcutaneous TID WC  . insulin aspart  0-5 Units Subcutaneous QHS  . insulin glargine  16 Units Subcutaneous QHS  . levothyroxine  50 mcg Oral Q0600  . metoprolol tartrate  50 mg Oral BID   Continuous Infusions: . heparin 1,500 Units/hr (11/17/20 0446)   PRN Meds: acetaminophen, nitroGLYCERIN, ondansetron (ZOFRAN) IV   Vital Signs    Vitals:   11/16/20 1747 11/16/20 1926 11/17/20 0500 11/17/20 0533  BP: 110/70 110/70  (!) 109/99  Pulse: 82 70  86  Resp: 19 19  19   Temp: 98.3 F (36.8 C) 97.7 F (36.5 C)  97.7 F (36.5 C)  TempSrc: Oral Oral  Oral  SpO2: 97% 96%  95%  Weight: 121.7 kg  123.2 kg   Height: 5\' 11"  (1.803 m)       Intake/Output Summary (Last 24 hours) at 11/17/2020 0749 Last data filed at 11/17/2020 0500 Gross per 24 hour  Intake 714.35 ml  Output 1153 ml  Net -438.65 ml   Filed Weights   11/15/20 2157 11/16/20 1747 11/17/20 0500  Weight: 122.5 kg 121.7 kg 123.2 kg    Telemetry    Afib with ventricular rates predominantly in the 80s bpm with rare peaks into the low 100s bpm, 5  beats of NSVT - Personally Reviewed  ECG    No new tracings - Personally Reviewed  Physical Exam   GEN: No acute distress.   Neck: JVD difficult to assess secondary to body habitus. Cardiac: IRIR, no murmurs, rubs, or gallops.  Respiratory: Mildly diminished along the bilateral bases.  GI: Soft, nontender, non-distended.   MS: No edema; No deformity. Neuro:  Alert and oriented x 3; Nonfocal.  Psych: Normal affect.  Labs    Chemistry Recent Labs  Lab 11/15/20 2155 11/16/20 0641 11/17/20 0248  NA 135 137 133*  K 3.9 4.2 4.1  CL 100 105 103  CO2 21* 22 21*  GLUCOSE 282* 244* 217*  BUN 41* 36* 34*  CREATININE 2.07* 1.79* 1.94*  CALCIUM 9.2 8.4* 8.6*  GFRNONAA 35* 41* 37*  ANIONGAP 14 10 9      Hematology Recent Labs  Lab 11/15/20 2155 11/16/20 0641 11/17/20 0248  WBC 13.3* 8.9 10.9*  RBC 6.11* 5.36 5.32  HGB 16.2 14.0 14.3  HCT 50.5 44.6 44.3  MCV 82.7 83.2 83.3  MCH 26.5 26.1 26.9  MCHC 32.1 31.4 32.3  RDW 17.8* 16.7* 17.0*  PLT 280 225 232    Cardiac EnzymesNo results for input(s): TROPONINI in the last 168 hours. No results for input(s): TROPIPOC in the last 168 hours.   BNP Recent Labs  Lab 11/16/20 0641  BNP 244.1*     DDimer No results for input(s): DDIMER in the last 168 hours.   Radiology    DG Chest Portable 1 View  Result Date: 11/15/2020 IMPRESSION: No active disease. Borderline to mild cardiomegaly. Electronically Signed   By: Donavan Foil M.D.   On: 11/15/2020 22:24   Cardiac Studies   2D echo 10/23/2017 [facility]): Summary:  1. Left ventricle septal thickness is mildly increased. --with  concentric LVH  2. The EF is estimated at 55-60%.  3. Abnormal diastolic filling pattern consistent with underlying atrial  fibrillation was observed. --with heart rate 66 to 94 bpm  4. The right ventricular cavity size is mildly enlarged.  5. The right atrium is mild to moderately dilated.   6. The inferior vena cava is not visualized.  7.  There is mild dilatation of the ascending aorta. --at 3.6 cm   Recommendation:  1. Consider non compilance of OSA therapy contributing as an etiology  for recent fluid retention symptoms __________  [facility] 12/22/2014 [facility]): PRESSURES:  RA Pressure: 31 mean mmHG  RV Pressure: 62 mmHG   RVEDP:  32 mmHG  PA Pressure: 64/48 mmHG   Mean PA pressure: 53 mmHG  PCWP: Mean 44 mmHG no V wave  AO pressure: 125/101 mmHG   Mean AO pressure:  161 mmHG  LV systolic pressure: 22 mmHG   LVEDP: 40 mmHG   OXYGEN SATURATIONS:  RA: 49 %  PA:  49 %  Femoral:  94 %  Other: N/A   CARDIAC OUTPUT:  Thermodilution cardiac output:  4.4 L/min  Thermodilution cardiac index:  1.9 L/min/m2  Fickcardiac output:  3.2 L/min  Fick cardiac index:  1.3 L/min/m2    CORONARY ANATOMY:  Left Main: This vessel bifurcated into LAD, small ramus branch, and  moderately sized left circumflex vessel. The left main artery had no  stenosis   Left anterior descending artery: This vessel gave rise to a long 2 mm  small-to medium-sized diagonal branch. The diagonal branch had a 99% mid  stenosis that was not the culprit of the patient's profound  cardiomyopathy. The proximal LAD was patent. The mid LAD had a  moderately long 30-50% stenosis.   Left circumflex artery: This vessel and its large mid obtuse marginal  branch and medium-sized distal obtuse marginal branch was free of disease.   Right coronary artery: This vessel gave rise to a long proximal vessel  stent with mild in-stent restenosis. The remainder of the RCA and its  medium-sized PDA and small-to medium-sized posterior lateral branch were  free of disease.    LEFT VENTRICULOGRAM: This was performed by hand injection to spare dye.   There is underfilling of the ventricle showing severe LV chamber  enlargement with severe global hypokinesis and left ventricular ejection  fraction less than 20%. There was  inability to use assess for mitral  regurgitation or aortic size.   CONCLUSIONS:  1. Severe small vessel disease of a single branch otherwise mild LAD  disease and very mild in-stent restenosis of the right coronary artery;  the cardiomyopathy is out of proportion to severity of coronary disease  2. Severe LV systolic dysfunction  3. Profound volume overload by right heart catheterization with marginal  cardiac index    PLAN:   1. IV Lasix diuresis  2. Continue to advance carvedilol and ACE inhibitor therapy  __________  2D echo 2/1/201([facility]): 1. Left ventricle septal thickness is mildly increased. --with mild  concentric  LVH  2. The left ventricular chamber size is mildly dilated.  3. Severe global hypokinesis of the left ventricle is observed.  4. The EF is estimated at 20-25%.  5. The left atrium is mildly dilated.  6. There is mild mitral regurgitation observed.  7. There is a small pleural effusion. --right sided  8. The inferior vena cava appears dilated suggesting elevated central  venous pressure. --mild increased  9. Since prior outside echo, there is significant decline in LVEF  10. Incidential gallstone noted     Patient Profile     64 y.o. male with history of CAD status post PCI x2 to the RCA in 2011, permanent A. fib, HFrEF, pulmonary hypertension, CKD stage III, DM2, prostate cancer, HTN, HLD, morbid obesity, and OSA not on CPAP who is being seen today for the evaluation of permanent A. fib with RVR and elevated troponin at the request of Dr. Damita Dunnings.  Assessment & Plan    1. CAD involving the native coronary arteries with demand ischemia: -Currently, chest pain free -Still notes some dyspnea and a dry cough -High-sensitivity troponin minimally elevated at 83 with a delta of 65, not consistent with ACS and likely present in the setting of demand ischemia secondary to volume overload and A. fib with RVR along with acute on chronic CKD -Echo with LVSF  35-40% this admission -He is addiment that he would not want dialysis, in the setting of his underlying CKD, he would like to defer cardiac cath at this time -Plan for Lexiscan MPI to evaluate for high-risk ischemia, going into this study, it will be abnormal to some degree given his cardiomyopathy and this has been discussed with him -Heparin for now as outlined below -ASA  2.  Presumed permanent A. fib: -Last EKG demonstrating sinus rhythm per interpretation in care everywhere is dated 2012 -He indicates his primary cardiologist previously discussed rhythm control strategy though elected to pursue rate control -Ventricular rates are well controlled this morning  -Left atrial dimension of 47 mm this admission -Metoprolol tartrate for rate control for now with recommendation to consolidate to Toprol or change to Coreg prior to discharge as below -CHADS2VASc at least 5 (CHF, HTN, age x 1, DM, vascular disease) -Start heparin drip for now until it is clear he will not require inpatient invasive procedures -Recommend transitioning to [place] prior to discharge -TSH 10.1  -Potassium and magnesium at goal -With noncompliance/intolerance to CPAP, history of medical nonadherence, ongoing morbid obesity, and presumed duration of being in A. fib, rhythm control strategy may be difficult to pursue  3. Acute on chronic HFrEF/pulmonary hypertension: -Likely exacerbated by medication/CPAP nonadherence and A. fib with RVR -Prior echo in 12/2014 showed an EF of 25% with follow up cath showing patent RCA stents and severe small vessel disease as above with subsequent improvement in LVSF with medical therapy by echo in 2018 -Echo this admission with an EF of 35-40%, cannot exclude ischemic cardiomyopathy vs tachy-mediated cardiomyopathy in the context of medication nonadherence  -He does not want a cardiac cath at this time, which is reasonable given his underlying CKD -In this setting, we will proceed with a  Lexiscan MPI today -NPO  -Reduce IV Lasix to 20 mg daily -Metoprolol tartrate for now for added rate control with recommendation to consolidate to Toprol XL or change to Coreg to optimize GDMT in the setting of his cardiomyopathy -Add losartan 12.5 mg daily -Not currently on spironolactone given CKD -Consider transition to [facility] in the outpatient  setting -Recommend addition of Jardiance or Farxiga prior to discharge given his cardiomyopathy and underlying diabetes  -Plan to repeat limited echo as an outpatient in ~ 3 months after GDMT has been optimized to evaluate for improvement in LVSF -CHF education -Daily weights -Strict I's and O's  4. OSA: -Not on CPAP at home secondary to intolerance and noncompliance -Recommend rechallenging during the inpatient setting along with referral to pulmonology as an outpatient to discuss possible alternatives  5. HLD: -LDL of 95 this admission while off statin therapy -Goal LDL less than 70 -Resume atorvastatin -Follow-up fasting lipid panel and liver function in approximately 8 weeks with recommendation to escalate therapy as indicated to achieve goal LDL of less than 70  6.  CKD stage III: -Stable -Likely exacerbated by congestion -Monitor with diuresis and addition of ARB  7.  Uncontrolled diabetes: -Likely in the setting of being out of medications for the past month -A1c 10.3 -Management per IM  8.  Morbid obesity: -Weight loss advised  9. Abnormal TSH: -Add on free T4/T3  For questions or updates, please contact [place] Please consult [email or web address] for contact info under Cardiology/STEMI.    Signed, Christell Faith, PA-C [place] Pager: [phone] 11/17/2020, 7:49 AM

## 2020-11-17 NOTE — Progress Notes (Signed)
ANTICOAGULATION CONSULT NOTE  Pharmacy Consult for heparin Indication: ACS/STEMI/afib  No Known Allergies  Patient Measurements: Height: 5\' 11"  (180.3 cm) Weight: 123.2 kg (271 lb 9.7 oz) IBW/kg (Calculated) : 75.3 Heparin Dosing Weight: 102 kg  Vital Signs: Temp: 98 F (36.7 C) (12/29 1916) Temp Source: Oral (12/29 1916) BP: 109/69 (12/29 1916) Pulse Rate: 85 (12/29 1916)  Labs: Recent Labs    11/15/20 2155 11/16/20 0641 11/16/20 1303 11/16/20 1904 11/17/20 0248 11/17/20 1208 11/17/20 2109  HGB 16.2 14.0  --   --  14.3  --   --   HCT 50.5 44.6  --   --  44.3  --   --   PLT 280 225  --   --  232  --   --   APTT  --   --  28  --   --   --   --   LABPROT  --   --  12.9  --   --   --   --   INR  --   --  1.0  --   --   --   --   HEPARINUNFRC  --   --  <0.10*   < > 0.24* 0.12* 0.53  CREATININE 2.07* 1.79*  --   --  1.94*  --   --   TROPONINIHS 83* 65*  --   --   --   --   --    < > = values in this interval not displayed.    Estimated Creatinine Clearance: 50.1 mL/min (A) (by C-G formula based on SCr of 1.94 mg/dL (H)).   Medical History: Past Medical History:  Diagnosis Date  . (HFpEF) heart failure with preserved ejection fraction ([place])   . CAD (coronary artery disease)   . Chronic kidney disease (CKD), stage III (moderate) (HCC)   . Diabetes mellitus without complication ([place])   . Essential hypertension   . Hyperlipidemia   . Morbid obesity ([place])   . OSA (obstructive sleep apnea)   . Permanent atrial fibrillation ([place])   . Prostate CA [facility])      Assessment: 64 year old male presented with SOB. Patient with h/o afib previously on Eliquis PTA. Patient unable to take medications for approximately one month because of insurance issues with starting new job. Pharmacy consulted for heparin drip pending r/o for possible invasive procedures. CHADSVASc 4.   Goal of Therapy:  Heparin level 0.3-0.7 units/ml Monitor platelets by anticoagulation protocol: Yes    Date    Time    HL       Rate  12/28   1904    0.36       1300units/hr ; thera x1 12/29 0248 0.24   Bolus 1500 units and increase rate to 1500 units/hr  12/29 1208 0.12 - no issue with infusion or lines per Joshua Landry (pharmacy resident, who spoke with RN).  12/29   2109   0.53 , therapeutic X 1   Plan:  12/29:  HL @ 2109 = 0.53 Will continue pt on current rate and draw confirmation level on 12/30 @ 0300.   Joshua Landry D  11/17/2020 10:09 PM

## 2020-11-17 NOTE — Progress Notes (Signed)
ANTICOAGULATION CONSULT NOTE  Pharmacy Consult for heparin Indication: ACS/STEMI/afib  No Known Allergies  Patient Measurements: Height: 5\' 11"  (180.3 cm) Weight: 121.7 kg (268 lb 4.8 oz) IBW/kg (Calculated) : 75.3 Heparin Dosing Weight: 102 kg  Vital Signs: Temp: 97.7 F (36.5 C) (12/28 1926) Temp Source: Oral (12/28 1926) BP: 110/70 (12/28 1926) Pulse Rate: 70 (12/28 1926)  Labs: Recent Labs    11/15/20 2155 11/16/20 0641 11/16/20 1303 11/16/20 1904 11/17/20 0248  HGB 16.2 14.0  --   --  14.3  HCT 50.5 44.6  --   --  44.3  PLT 280 225  --   --  232  APTT  --   --  28  --   --   LABPROT  --   --  12.9  --   --   INR  --   --  1.0  --   --   HEPARINUNFRC  --   --  <0.10* 0.36 0.24*  CREATININE 2.07* 1.79*  --   --  1.94*  TROPONINIHS 83* 65*  --   --   --     Estimated Creatinine Clearance: 49.7 mL/min (A) (by C-G formula based on SCr of 1.94 mg/dL (H)).   Medical History: Past Medical History:  Diagnosis Date  . (HFpEF) heart failure with preserved ejection fraction ([place])   . CAD (coronary artery disease)   . Chronic kidney disease (CKD), stage III (moderate) (HCC)   . Diabetes mellitus without complication ([place])   . Essential hypertension   . Hyperlipidemia   . Morbid obesity ([place])   . OSA (obstructive sleep apnea)   . Permanent atrial fibrillation ([place])   . Prostate CA [facility])      Assessment: 64 year old male presented with SOB. Patient with h/o afib previously on Eliquis PTA. Patient unable to take medications for approximately one month because of insurance issues with starting new job. Pharmacy consulted for heparin drip pending r/o for possible invasive procedures.  Hgb: 16.2>14 Plts: 280>225  Goal of Therapy:  Heparin level 0.3-0.7 units/ml Monitor platelets by anticoagulation protocol: Yes   Date    Time    HL       Rate  12/28   1904    0.36       1300units/hr ; thera x1 12/29 0248 0.24   Subtherapeutic   Plan:  Bolus of 1500 units.   Increase heparin infusion rate to 1500 units/hr.  Recheck level in 6hrs until therapeutic x 2, then check HL with CBC daily while on heparin drip.  Renda Rolls, PharmD, [facility] 11/17/2020 4:16 AM

## 2020-11-17 NOTE — Progress Notes (Signed)
Night floor coverage:  Asymptomatic 12 beat run of V. tach.  Magnesium and potassium within normal limits earlier today, magnesium 2.1, potassium 4.1. We will continue to monitor for now

## 2020-11-17 NOTE — Progress Notes (Addendum)
Inpatient Diabetes Program Recommendations  AACE/ADA: New Consensus Statement on Inpatient Glycemic Control   Target Ranges:  Prepandial:   less than 140 mg/dL      Peak postprandial:   less than 180 mg/dL (1-2 hours)      Critically ill patients:  140 - 180 mg/dL  Results for Joshua Landry, Joshua Landry" (MRN [id number]) as of 11/17/2020 06:45  Ref. Range 11/17/2020 02:48  Glucose Latest Ref Range: 70 - 99 mg/dL 217 (H)   Results for Joshua Landry, Joshua Landry" (MRN [id number]) as of 11/17/2020 06:45  Ref. Range 11/16/2020 07:47 11/16/2020 11:36 11/16/2020 17:10 11/16/2020 21:07  Glucose-Capillary Latest Ref Range: 70 - 99 mg/dL 213 (H) 320 (H) 180 (H) 251 (H)   Results for Joshua Landry, Joshua Landry" (MRN [id number]) as of 11/17/2020 06:45  Ref. Range 11/16/2020 06:41  Hemoglobin A1C Latest Ref Range: 4.8 - 5.6 % 10.3 (H)   Review of Glycemic Control  Diabetes history: DM2 Outpatient Diabetes medications: Jardiance 25 mg daily, Metformin 1000 mg BID (not taken any meds in 1 month due to insurance issues) Current orders for Inpatient glycemic control: Lantus 16 units QHS, Novolog 0-15 units TID with meals, Novolog 0-5 units QHS  Inpatient Diabetes Program Recommendations:    Insulin: Please consider increasing Lantus to 18 units QHS and adding Novolog 3 units TID with meals for meal coverage if patient eats at least 50% of meals.  HbgA1C: A1C 10.3% on 11/16/20 indicating an average glucose of 249 mg/dl over the past 2-3 months. Per notes in the chart, patient has not taken any medications in past 1 month due to insurance issue. Patient now has insurance and will need Rx for DM medications at time of discharge.  Addendum 11/17/20@12 :64: Spoke with patient over the phone about diabetes and being out of medications.  Patient states that he was working in [place], [place] area and he got a new job at [employer] ([place] area) and he did not have any insurance for 3 months. Patient states that he took DM  medications until he ran out about 1 month ago.  Patient reports that he could not afford his medications without insurance. Patient was taking Jardiance and Metformin outpatient.  Discussed cost of Metformin ($4 list at [facility] for 30 day supply) but noted cash price of Jardiance is over $500 for 30 day supply. Patient states he was not aware that Metformin was affordable.  Patient states that his glucose was usually in the low 100's mg/dl with Jardiance and Metformin.  Patient states that his insurance through his new employer is now active and he should be able to get DM medications.  Discussed savings card to help with copays if needed and patient states he is aware of copay cards and had been using one for Jardiance.  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  However, patient is now working in [place] area at [employer] and he would like to find a local PCP and a Tax adviser.  Placed consult for [facility] to see if a list of local providers taking new patient is available. Also encouraged patient to check with insurance to see which providers in the area are in network with new insurance. Discussed A1C results (10.3% on 11/16/20 ) and explained that current A1C indicates an average glucose of 249 mg/dl over the past 2-3 months. Discussed glucose and A1C goals. Discussed importance of checking CBGs and maintaining good CBG control to  prevent long-term and short-term complications. Explained how hyperglycemia leads to damage within blood vessels which lead to the common complications seen with uncontrolled diabetes. Stressed to the patient the importance of improving glycemic control to prevent further complications from uncontrolled diabetes. Encouraged patient to get back on DM medications, take DM medications consistently, check glucose consistently, and establish care with new local PCP for follow up and ongoing DM management. Patient  reports that he has all needed supplies for glucose monitoring at home. Patient verbalized understanding of information discussed and reports no further questions at this time related to diabetes.   Thanks, Barnie Alderman, RN, MSN, CDE Diabetes Coordinator Inpatient Diabetes Program [phone] (Team Pager from 8am to 5pm)

## 2020-11-18 ENCOUNTER — Telehealth: Payer: Self-pay | Admitting: *Deleted

## 2020-11-18 DIAGNOSIS — I420 Dilated cardiomyopathy: Secondary | ICD-10-CM | POA: Diagnosis not present

## 2020-11-18 DIAGNOSIS — N1832 Chronic kidney disease, stage 3b: Secondary | ICD-10-CM

## 2020-11-18 DIAGNOSIS — I482 Chronic atrial fibrillation, unspecified: Secondary | ICD-10-CM

## 2020-11-18 DIAGNOSIS — I4891 Unspecified atrial fibrillation: Secondary | ICD-10-CM | POA: Diagnosis not present

## 2020-11-18 DIAGNOSIS — I25118 Atherosclerotic heart disease of native coronary artery with other forms of angina pectoris: Secondary | ICD-10-CM | POA: Diagnosis not present

## 2020-11-18 DIAGNOSIS — I5033 Acute on chronic diastolic (congestive) heart failure: Secondary | ICD-10-CM | POA: Diagnosis not present

## 2020-11-18 DIAGNOSIS — R079 Chest pain, unspecified: Secondary | ICD-10-CM | POA: Diagnosis not present

## 2020-11-18 DIAGNOSIS — R778 Other specified abnormalities of plasma proteins: Secondary | ICD-10-CM | POA: Diagnosis not present

## 2020-11-18 DIAGNOSIS — E1122 Type 2 diabetes mellitus with diabetic chronic kidney disease: Secondary | ICD-10-CM

## 2020-11-18 DIAGNOSIS — I10 Essential (primary) hypertension: Secondary | ICD-10-CM

## 2020-11-18 LAB — BASIC METABOLIC PANEL
Anion gap: 8 (ref 5–15)
BUN: 42 mg/dL — ABNORMAL HIGH (ref 8–23)
CO2: 23 mmol/L (ref 22–32)
Calcium: 8.8 mg/dL — ABNORMAL LOW (ref 8.9–10.3)
Chloride: 105 mmol/L (ref 98–111)
Creatinine, Ser: 2.17 mg/dL — ABNORMAL HIGH (ref 0.61–1.24)
GFR, Estimated: 33 mL/min — ABNORMAL LOW (ref >60)
Glucose, Bld: 185 mg/dL — ABNORMAL HIGH (ref 70–99)
Potassium: 4.4 mmol/L (ref 3.5–5.1)
Sodium: 136 mmol/L (ref 135–145)

## 2020-11-18 LAB — CBC
HCT: 44 % (ref 39.0–52.0)
Hemoglobin: 13.4 g/dL (ref 13.0–17.0)
MCH: 25.8 pg — ABNORMAL LOW (ref 26.0–34.0)
MCHC: 30.5 g/dL (ref 30.0–36.0)
MCV: 84.8 fL (ref 80.0–100.0)
Platelets: 238 10*3/uL (ref 150–400)
RBC: 5.19 MIL/uL (ref 4.22–5.81)
RDW: 17.2 % — ABNORMAL HIGH (ref 11.5–15.5)
WBC: 9.7 10*3/uL (ref 4.0–10.5)
nRBC: 0 % (ref 0.0–0.2)

## 2020-11-18 LAB — GLUCOSE, CAPILLARY
Glucose-Capillary: 169 mg/dL — ABNORMAL HIGH (ref 70–99)
Glucose-Capillary: 230 mg/dL — ABNORMAL HIGH (ref 70–99)

## 2020-11-18 LAB — T3, FREE: T3, Free: 2.7 pg/mL (ref 2.0–4.4)

## 2020-11-18 LAB — HEPARIN LEVEL (UNFRACTIONATED): Heparin Unfractionated: 0.6 IU/mL (ref 0.30–0.70)

## 2020-11-18 MED ORDER — APIXABAN 5 MG PO TABS
5.0000 mg | ORAL_TABLET | Freq: Two times a day (BID) | ORAL | Status: DC
  Administered 2020-11-18: 5 mg via ORAL
  Filled 2020-11-18: qty 1

## 2020-11-18 MED ORDER — METOPROLOL SUCCINATE ER 100 MG PO TB24
100.0000 mg | ORAL_TABLET | Freq: Every day | ORAL | Status: DC
  Administered 2020-11-18: 100 mg via ORAL
  Filled 2020-11-18: qty 1

## 2020-11-18 MED ORDER — FUROSEMIDE 20 MG PO TABS: 20.0000 mg | ORAL_TABLET | Freq: Every day | ORAL | Status: DC

## 2020-11-18 MED ORDER — METOPROLOL SUCCINATE ER 100 MG PO TB24
100.0000 mg | ORAL_TABLET | Freq: Every day | ORAL | 0 refills | Status: DC
Start: 2020-11-19 — End: 2020-11-26

## 2020-11-18 MED ORDER — LOSARTAN POTASSIUM 25 MG PO TABS: 12.5000 mg | ORAL_TABLET | Freq: Every day | ORAL | 0 refills | Status: DC

## 2020-11-18 MED ORDER — LEVOTHYROXINE SODIUM 50 MCG PO TABS: 50.0000 ug | ORAL_TABLET | Freq: Every day | ORAL | 0 refills | Status: AC

## 2020-11-18 NOTE — Progress Notes (Signed)
Progress Note  Patient Name: Joshua Landry Date of Encounter: 11/18/2020  Primary Cardiologist: Former [facility] consult by End Subjective   No chest pain, dyspnea, palpitations, dizziness, presyncope, or syncope.  Lexiscan MPI 11/17/2020 without evidence for ischemia and was overall intermediate risk due to reduced LVSF. Documented UOP unchanged at 438 mL for the past 24 hours and admission. Weight 123.2-->122.8 kg. Renal function abnormal, and slightly elevated this morning with a baseline around 1.8. Echo showed an EF of 35-40%, global HK, mild LVH, mildly reduced RVSF with normal RV cavity size, moderately dilated left atrium measuring 47 mm, mildly to moderately dilated right atrium, trivial mitral regurgitation and aortic valve insufficiency, mild aortic sclerosis without stenosis.   Inpatient Medications    Scheduled Meds: . aspirin EC  81 mg Oral Daily  . atorvastatin  40 mg Oral Daily  . furosemide  20 mg Oral Daily  . insulin aspart  0-15 Units Subcutaneous TID WC  . insulin aspart  0-5 Units Subcutaneous QHS  . insulin glargine  16 Units Subcutaneous QHS  . levothyroxine  50 mcg Oral Q0600  . losartan  12.5 mg Oral Daily  . metoprolol tartrate  50 mg Oral BID   Continuous Infusions: . heparin 1,800 Units/hr (11/17/20 1944)   PRN Meds: acetaminophen, nitroGLYCERIN, ondansetron (ZOFRAN) IV   Vital Signs    Vitals:   11/17/20 1916 11/17/20 2222 11/18/20 0428 11/18/20 0458  BP: 109/69 118/83 111/65   Pulse: 85 84 68   Resp: 18 18 19    Temp: 98 F (36.7 C) 97.7 F (36.5 C) (!) 97.4 F (36.3 C)   TempSrc: Oral Oral Oral   SpO2: 95% 97% 93%   Weight:    122.8 kg  Height:        Intake/Output Summary (Last 24 hours) at 11/18/2020 0725 Last data filed at 11/18/2020 0217 Gross per 24 hour  Intake --  Output 0 ml  Net 0 ml   Filed Weights   11/16/20 1747 11/17/20 0500 11/18/20 0458  Weight: 121.7 kg 123.2 kg 122.8 kg    Telemetry    Afib with  ventricular rates predominantly in the 80s bpm with rare peaks into the low 100s bpm, 5 beats of NSVT - Personally Reviewed  ECG    No new tracings - Personally Reviewed  Physical Exam   GEN: No acute distress.   Neck: JVD difficult to assess secondary to body habitus. Cardiac: IRIR, no murmurs, rubs, or gallops.  Respiratory: Mildly diminished along the bilateral bases.  GI: Soft, nontender, non-distended.   MS: No edema; No deformity. Neuro:  Alert and oriented x 3; Nonfocal.  Psych: Normal affect.  Labs    Chemistry Recent Labs  Lab 11/16/20 0641 11/17/20 0248 11/18/20 0251  NA 137 133* 136  K 4.2 4.1 4.4  CL 105 103 105  CO2 22 21* 23  GLUCOSE 244* 217* 185*  BUN 36* 34* 42*  CREATININE 1.79* 1.94* 2.17*  CALCIUM 8.4* 8.6* 8.8*  GFRNONAA 41* 37* 33*  ANIONGAP 10 9 8      Hematology Recent Labs  Lab 11/16/20 0641 11/17/20 0248 11/18/20 0251  WBC 8.9 10.9* 9.7  RBC 5.36 5.32 5.19  HGB 14.0 14.3 13.4  HCT 44.6 44.3 44.0  MCV 83.2 83.3 84.8  MCH 26.1 26.9 25.8*  MCHC 31.4 32.3 30.5  RDW 16.7* 17.0* 17.2*  PLT 225 232 238    Cardiac EnzymesNo results for input(s): TROPONINI in the last 168 hours.  No results for input(s): TROPIPOC in the last 168 hours.   BNP Recent Labs  Lab 11/16/20 0641  BNP 244.1*     DDimer No results for input(s): DDIMER in the last 168 hours.   Radiology    DG Chest Portable 1 View  Result Date: 11/15/2020 IMPRESSION: No active disease. Borderline to mild cardiomegaly. Electronically Signed   By: Donavan Foil M.D.   On: 11/15/2020 22:24   Cardiac Studies   2D echo 10/23/2017 [facility]): Summary:  1. Left ventricle septal thickness is mildly increased. --with  concentric LVH  2. The EF is estimated at 55-60%.  3. Abnormal diastolic filling pattern consistent with underlying atrial  fibrillation was observed. --with heart rate 66 to 94 bpm  4. The right ventricular cavity size is mildly enlarged.  5. The right atrium  is mild to moderately dilated.   6. The inferior vena cava is not visualized.  7. There is mild dilatation of the ascending aorta. --at 3.6 cm   Recommendation:  1. Consider non compilance of OSA therapy contributing as an etiology  for recent fluid retention symptoms __________  [facility] 12/22/2014 [facility]): PRESSURES:  RA Pressure: 31 mean mmHG  RV Pressure: 62 mmHG   RVEDP:  32 mmHG  PA Pressure: 64/48 mmHG   Mean PA pressure: 53 mmHG  PCWP: Mean 44 mmHG no V wave  AO pressure: 125/101 mmHG   Mean AO pressure:  656 mmHG  LV systolic pressure: 22 mmHG   LVEDP: 40 mmHG   OXYGEN SATURATIONS:  RA: 49 %  PA:  49 %  Femoral:  94 %  Other: N/A   CARDIAC OUTPUT:  Thermodilution cardiac output:  4.4 L/min  Thermodilution cardiac index:  1.9 L/min/m2  Fickcardiac output:  3.2 L/min  Fick cardiac index:  1.3 L/min/m2    CORONARY ANATOMY:  Left Main: This vessel bifurcated into LAD, small ramus branch, and  moderately sized left circumflex vessel. The left main artery had no  stenosis   Left anterior descending artery: This vessel gave rise to a long 2 mm  small-to medium-sized diagonal branch. The diagonal branch had a 99% mid  stenosis that was not the culprit of the patient's profound  cardiomyopathy. The proximal LAD was patent. The mid LAD had a  moderately long 30-50% stenosis.   Left circumflex artery: This vessel and its large mid obtuse marginal  branch and medium-sized distal obtuse marginal branch was free of disease.   Right coronary artery: This vessel gave rise to a long proximal vessel  stent with mild in-stent restenosis. The remainder of the RCA and its  medium-sized PDA and small-to medium-sized posterior lateral branch were  free of disease.    LEFT VENTRICULOGRAM: This was performed by hand injection to spare dye.   There is underfilling of the ventricle showing severe LV chamber  enlargement with severe  global hypokinesis and left ventricular ejection  fraction less than 20%. There was inability to use assess for mitral  regurgitation or aortic size.   CONCLUSIONS:  1. Severe small vessel disease of a single branch otherwise mild LAD  disease and very mild in-stent restenosis of the right coronary artery;  the cardiomyopathy is out of proportion to severity of coronary disease  2. Severe LV systolic dysfunction  3. Profound volume overload by right heart catheterization with marginal  cardiac index    PLAN:   1. IV Lasix diuresis  2. Continue to advance carvedilol and ACE inhibitor therapy  __________  2D echo 2/1/201([facility]): 1. Left ventricle septal thickness is mildly increased. --with mild  concentric LVH  2. The left ventricular chamber size is mildly dilated.  3. Severe global hypokinesis of the left ventricle is observed.  4. The EF is estimated at 20-25%.  5. The left atrium is mildly dilated.  6. There is mild mitral regurgitation observed.  7. There is a small pleural effusion. --right sided  8. The inferior vena cava appears dilated suggesting elevated central  venous pressure. --mild increased  9. Since prior outside echo, there is significant decline in LVEF  10. Incidential gallstone noted  __________  2D echo 11/16/2020: 1. Left ventricular ejection fraction, by estimation, is 35 to 40%. The  left ventricle has moderately decreased function. The left ventricle  demonstrates global hypokinesis. There is mild left ventricular  hypertrophy. Left ventricular diastolic  parameters are indeterminate.  2. Right ventricular systolic function is mildly reduced. The right  ventricular size is normal. Tricuspid regurgitation signal is inadequate  for assessing PA pressure.  3. Left atrial size was moderately dilated.  4. Right atrial size was mild to moderately dilated.  5. The mitral valve is normal in structure. Trivial mitral valve  regurgitation. No  evidence of mitral stenosis.  6. The aortic valve is tricuspid. There is mild thickening of the aortic  valve. Aortic valve regurgitation is trivial. Mild aortic valve sclerosis  is present, with no evidence of aortic valve stenosis. __________  [facility] MPI 11/17/2020:  There was no ST segment deviation noted during stress.  No T wave inversion was noted during stress.  This is an intermediate risk study due to reduced ejection fraction.  The left ventricular ejection fraction is moderately decreased visually (35%). measured EF was 20%. correlation with echo advised  There was no evidence for ischemia  Patient Profile     64 y.o. male with history of CAD status post PCI x2 to the RCA in 2011, permanent A. fib, HFrEF, pulmonary hypertension, CKD stage III, DM2, prostate cancer, HTN, HLD, morbid obesity, and OSA not on CPAP who is being seen today for the evaluation of permanent A. fib with RVR and elevated troponin at the request of Dr. Damita Dunnings.  Assessment & Plan    1. CAD involving the native coronary arteries with demand ischemia: -Currently, chest pain free -Still notes some dyspnea and a dry cough -High-sensitivity troponin minimally elevated at 83 with a delta of 65, not consistent with ACS and likely present in the setting of demand ischemia secondary to volume overload and A. fib with RVR along with acute on chronic CKD -Echo with LVSF 35-40% this admission -Upon discussing his cardiomyopathy, he was addiment that he would not want dialysis, in the setting of his underlying CKD, he deferred cardiac cath at this time and underwent Lexiscan MPI which was showed no evidence of high-risk ischemia -ASA  2.  Presumed permanent A. fib: -Last EKG demonstrating sinus rhythm per interpretation in care everywhere is dated 2012 -He indicates his primary cardiologist previously discussed rhythm control strategy though elected to pursue rate control -Ventricular rates are well  controlled this morning  -Left atrial dimension of 47 mm this admission -Consolidate Lopressor to Toprol XL as below -CHADS2VASc at least 5 (CHF, HTN, age x 1, DM, vascular disease) -Transition heparin gtt to Eliquis 5 mg bid  -TSH 10.1 with normal free T4/T3 -Potassium and magnesium at goal -With noncompliance/intolerance to CPAP, history of medical nonadherence, ongoing morbid obesity, and presumed duration of being  in A. fib, rhythm control strategy may be difficult to pursue  3. Acute on chronic HFrEF/pulmonary hypertension: -Likely exacerbated by medication/CPAP nonadherence and A. fib with RVR -Prior echo in 12/2014 showed an EF of 25% with follow up cath showing patent RCA stents and severe small vessel disease as above with subsequent improvement in LVSF with medical therapy by echo in 2018 -Echo this admission with an EF of 35-40%, cannot exclude ischemic cardiomyopathy vs tachy-mediated cardiomyopathy in the context of medication nonadherence  -He does not want a cardiac cath at this time -Lexiscan MPI without evidence of ischemia as above, consider outpatient R/LHC based on renal function  -With slight bump in renal function, will provide Lasix holiday  -Consolidate Lopressor to Toprol XL to optimize GDMT in the setting of his cardiomyopathy -Continue losartan 12.5 mg daily -Not currently on spironolactone given CKD -Consider transition to Entresto in the outpatient setting -Recommend addition of Jardiance or Farxiga in the outpatient setting given his cardiomyopathy and underlying diabetes  -Plan to repeat limited echo as an outpatient in ~ 3 months after GDMT has been optimized to evaluate for improvement in LVSF -CHF education -Daily weights -Strict I's and O's  4. OSA: -Not on CPAP at home secondary to intolerance and noncompliance -Recommend rechallenging during the inpatient setting along with referral to pulmonology as an outpatient to discuss possible  alternatives  5. HLD: -LDL of 95 this admission while off statin therapy -Goal LDL less than 70 -He has been resumed on atorvastatin -Follow-up fasting lipid panel and liver function in approximately 8 weeks with recommendation to escalate therapy as indicated to achieve goal LDL of less than 70  6.  CKD stage III: -Slightly elevated today -Diuretic holiday   -Monitor   7.  Uncontrolled diabetes: -Likely in the setting of being out of medications for the past month -A1c 10.3 -Management per IM  8.  Morbid obesity: -Weight loss advised  9. Abnormal TSH: -Free T4/T3 normal  Dispo: -From a cardiac perspective, he may be discharged on current medications with follow up in our office in 1-2 weeks, once he has been staffed by Dr. Rockey Situ. We will arrange this. We will also arrange a follow up BMP to be obtained early next week.   For questions or updates, please contact [place] Please consult [email or web address] for contact info under Cardiology/STEMI.    Signed, Christell Faith, PA-C [place] Pager: [phone] 11/18/2020, 7:25 AM

## 2020-11-18 NOTE — Plan of Care (Signed)
  Problem: Education: Goal: Knowledge of General Education information will improve Description: Including pain rating scale, medication(s)/side effects and non-pharmacologic comfort measures Outcome: Adequate for Discharge   Problem: Health Behavior/Discharge Planning: Goal: Ability to manage health-related needs will improve Outcome: Adequate for Discharge   Problem: Clinical Measurements: Goal: Will remain free from infection Outcome: Adequate for Discharge Goal: Diagnostic test results will improve Outcome: Adequate for Discharge

## 2020-11-18 NOTE — Progress Notes (Signed)
PT Cancellation Note  Patient Details Name: Joshua Landry MRN: [id number] DOB: 1956/04/29   Cancelled Treatment:    Reason Eval/Treat Not Completed: PT screened, no needs identified, will sign off (Consult received and chart reviewed. Evaluation attempted. Patient endorses indep mobilization in room, no cardiac symptoms, no functional deficits.  Declines formal PT evaluation at this time; anticipating discharge this date.  Discussed with cardiologist following patients; verifies patient "moving around fine" and expected for discharge this PM.  Will complete initial order at this time; please re-consult should needs change.)   Dyanna Seiter H. Owens Shark, PT, DPT, NCS 11/18/20, 11:32 AM [phone]

## 2020-11-18 NOTE — Progress Notes (Signed)
OT Cancellation Note  Patient Details Name: Joshua Landry MRN: [id number] DOB: 1956-07-18   Cancelled Treatment:    Reason Eval/Treat Not Completed: OT screened, no needs identified, will sign off. OT order received and chart reviewed. Per conversation c PT pt denies therapy needs at this time, is Indep c mobility in room and anticipating d/c this date. Will sign off, please re-consult at later date/time if new needs arise.   Dessie Coma, M.S. OTR/L  11/18/20, 11:43 AM  ascom [phone]

## 2020-11-18 NOTE — Progress Notes (Signed)
ANTICOAGULATION CONSULT NOTE  Pharmacy Consult for heparin Indication: ACS/STEMI/afib  No Known Allergies  Patient Measurements: Height: 5\' 11"  (180.3 cm) Weight: 122.8 kg (270 lb 11.2 oz) IBW/kg (Calculated) : 75.3 Heparin Dosing Weight: 102 kg  Vital Signs: Temp: 97.4 F (36.3 C) (12/30 0428) Temp Source: Oral (12/30 0428) BP: 111/65 (12/30 0428) Pulse Rate: 68 (12/30 0428)  Labs: Recent Labs    11/15/20 2155 11/16/20 0641 11/16/20 1303 11/16/20 1904 11/17/20 0248 11/17/20 1208 11/17/20 2109 11/18/20 0251  HGB 16.2 14.0  --   --  14.3  --   --  13.4  HCT 50.5 44.6  --   --  44.3  --   --  44.0  PLT 280 225  --   --  232  --   --  238  APTT  --   --  28  --   --   --   --   --   LABPROT  --   --  12.9  --   --   --   --   --   INR  --   --  1.0  --   --   --   --   --   HEPARINUNFRC  --   --  <0.10*   < > 0.24* 0.12* 0.53 0.60  CREATININE 2.07* 1.79*  --   --  1.94*  --   --  2.17*  TROPONINIHS 83* 65*  --   --   --   --   --   --    < > = values in this interval not displayed.    Estimated Creatinine Clearance: 44.7 mL/min (A) (by C-G formula based on SCr of 2.17 mg/dL (H)).   Medical History: Past Medical History:  Diagnosis Date  . (HFpEF) heart failure with preserved ejection fraction ([place])   . CAD (coronary artery disease)   . Chronic kidney disease (CKD), stage III (moderate) ([facility])   . Diabetes mellitus without complication ([place])   . Essential hypertension   . Hyperlipidemia   . Morbid obesity ([place])   . OSA (obstructive sleep apnea)   . Permanent atrial fibrillation ([place])   . Prostate CA [facility])      Assessment: 64 year old male presented with SOB. Patient with h/o afib previously on Eliquis PTA. Patient unable to take medications for approximately one month because of insurance issues with starting new job. Pharmacy consulted for heparin drip pending r/o for possible invasive procedures. CHADSVASc 4.   Goal of Therapy:  Heparin level 0.3-0.7  units/ml Monitor platelets by anticoagulation protocol: Yes   Date    Time    HL       Rate  12/28   1904    0.36       1300units/hr ; thera x1 12/29 0248 0.24   [place] 1500 units and increase rate to 1500 units/hr  12/29 1208 0.12 - no issue with infusion or lines per Lilia Pro (pharmacy resident, who spoke with RN).  12/29   2109   0.53 , therapeutic X 1  12/30 0251 0.60, therapeutic x 2  Plan:  Will continue pt on current rate and start daily AM HLs.   Renda Rolls, PharmD, MBA 11/18/2020 5:00 AM

## 2020-11-18 NOTE — Discharge Summary (Signed)
Physician Discharge Summary  Joshua Landry HYQ:[id number] DOB: 1956-07-24 DOA: 11/15/2020  PCP: Patient, No Pcp Per  Admit date: 11/15/2020 Discharge date: 11/18/2020  Admitted From: home Disposition: home  Recommendations for Outpatient Follow-up:  1. Follow up with PCP in 1-2 weeks 2. Please obtain TSH, FT3, FT4 in 4-6 weeks to see if levothyroxine needs to be adjusted 3. F/u cardio in 1 week ([facility])  Home Health: no  Equipment/Devices:   Discharge Condition: stable CODE STATUS: full  Diet recommendation: Heart Healthy / Carb Modified   Brief/Interim Summary: HPI was taken from Dr. Damita Dunnings: Joshua Landry is a 64 y.o. male with medical history significant for CKD 3B, atrial fibrillation, diastolic heart failure and cardiomyopathy , CAD with history of MI x2 and prostate cancer, who has not taken medication in the past month due to insurance issues, who presents to the emergency room with intermittent chest pain.  He said he was in his usual state of health until a week ago when he developed a cough associated with shortness of breath, that is worse when he takes a deep breath.  He is fully vaccinated against Covid.  He said today while still sitting in his car, he developed retrosternal chest pain, similar to his previous MI but not quite as intense., pain is retrosternal, nonradiating of moderate intensity.  It was relieved after his second nitroglycerin.  He denies associated nausea, vomiting, diaphoresis, palpitations or lightheadedness. He denies lower extremity pain or swelling.  ED Course: On arrival in the emergency room he was found to be in rapid A. fib with a rate in the 160s, BP 155/129, respirations 22 with O2 sat 98% on room air.  Blood work significant for first troponin of 80, leukocytosis of 13,000, creatinine of 2.07 which is around his baseline of 1.8, and blood sugar of 282. EKG as reviewed by me : A. fib with RVR of 163 Imaging: Chest x-ray with no acute  findings  Patient was treated with a single diltiazem bolus in the emergency room with improvement in rate to 100-110.  Hospitalist consulted for admission.  Hospital Course from Dr. Jimmye Norman 12/19-12/30/21: Pt presented w/ chest pain and cardio was consulted. Cardiac stress test was done which showed no significant ischemia noted, low risk study as per cardio. Pt declined cardiac cath. Pt will be d/c home w/ metoprolol, losartan, eliquis, aspirin, lasix and will f/u w/ cardio in 1 week. Of note, pt was found to have new onset hypothyrodism and was started on levothyroxine. Pt will need repeat TSH, FT3, FT4 in 4- 6 weeks. For more information, please see previous progress/consult notes.  Discharge Diagnoses:  Principal Problem:   Atrial fibrillation with rapid ventricular response (HCC) Active Problems:   Type 2 diabetes mellitus with stage 3 chronic kidney disease (HCC)   Stage 3b chronic kidney disease (HCC)   Current use of long term anticoagulation   Dilated cardiomyopathy (HCC)   Essential hypertension   History of MI (myocardial infarction)   Elevated troponin   Chronic diastolic CHF (congestive heart failure) (HCC)   Chest pain   Rapid atrial fibrillation (HCC)   Acute on chronic diastolic (congestive) heart failure (HCC)   HFrEF (heart failure with reduced ejection fraction) (HCC)  Chest pain: etiology unclear. Cardiac stress test showed no significant ischemia, low risk study 11/17/20 as per cardio. Hx of CAD s/p stents  Permanent atrial fibrillation: w/ RVR. Continue on metoprolol & restart home dose of eliquis. Continue on tele. Cardio following and recs  apprec  Acute on chronic combined CHF: continue on  lasix, metoprolol, losartan. Monitor I/Os.   HLD: continue on statin   Leukocytosis: likely reactive.   AKI on CKDIIIb: Cr is labile. Avoid nephrotoxic meds  DM2: poorly controlled. Continue on lantus, SSI w/ accuchecks   Hypothyroidism: new onset. Continue on  sythroid   Discharge Instructions  Discharge Instructions    Amb referral to [facility]   Complete by: As directed    Diet - low sodium heart healthy   Complete by: As directed    Diet Carb Modified   Complete by: As directed    Discharge instructions   Complete by: As directed    F/u w/ PCP in 1-2 weeks and needs TSH, FT3 & FT4 to see if levothyroxine need to be adjusted in 4-6 weeks. F/u w/ cardio in 1 week   Increase activity slowly   Complete by: As directed      Allergies as of 11/18/2020   No Known Allergies     Medication List    STOP taking these medications   carvedilol 25 MG tablet Commonly known as: COREG   lisinopril 10 MG tablet Commonly known as: ZESTRIL     TAKE these medications   apixaban 5 MG Tabs tablet Commonly known as: ELIQUIS Take 5 mg by mouth 2 (two) times daily.   aspirin 81 MG EC tablet Take 81 mg by mouth daily.   atorvastatin 80 MG tablet Commonly known as: LIPITOR Take 80 mg by mouth at bedtime.   empagliflozin 25 MG Tabs tablet Commonly known as: JARDIANCE Take 25 mg by mouth daily.   furosemide 20 MG tablet Commonly known as: LASIX Take 20 mg by mouth See admin instructions. Take 20 mg every day and 1 tablet if you have a 5 lb weight gain in 1 day.   isosorbide mononitrate 30 MG 24 hr tablet Commonly known as: IMDUR Take 30 mg by mouth daily.   levothyroxine 50 MCG tablet Commonly known as: SYNTHROID Take 1 tablet (50 mcg total) by mouth daily at 6 (six) AM. Start taking on: November 19, 2020   losartan 25 MG tablet Commonly known as: COZAAR Take 0.5 tablets (12.5 mg total) by mouth daily. Start taking on: November 19, 2020   metFORMIN 1000 MG tablet Commonly known as: GLUCOPHAGE Take 1,000 mg by mouth 2 (two) times daily.   metoprolol succinate 100 MG 24 hr tablet Commonly known as: TOPROL-XL Take 1 tablet (100 mg total) by mouth daily. Take with or immediately following a meal. Start taking on: November 19, 2020   nitroGLYCERIN 0.4 MG SL tablet Commonly known as: NITROSTAT Place 0.4 mg under the tongue See admin instructions. Place 1 tablet under the tongue every 5 minutes as needed for chest pain. Max of 3 doses in 15 minutes.   potassium chloride SA 20 MEQ tablet Commonly known as: KLOR-CON Take 20 mEq by mouth daily.   tamsulosin 0.4 MG Caps capsule Commonly known as: FLOMAX Take 0.4 mg by mouth daily.   Vitamin D3 10 MCG (400 UNIT) Caps Take 400 Units by mouth daily.       No Known Allergies  Consultations:  Cardio ([facility])   Procedures/Studies: NM Myocar Multi W/Spect W/Wall Motion / EF  Result Date: 11/17/2020  There was no ST segment deviation noted during stress.  No T wave inversion was noted during stress.  This is an intermediate risk study due to reduced ejection fraction.  The left ventricular  ejection fraction is moderately decreased visually (35%). measured EF was 20%. correlation with echo advised  There was no evidence for ischemia    DG Chest Portable 1 View  Result Date: 11/15/2020 CLINICAL DATA:  Chest pain EXAM: PORTABLE CHEST 1 VIEW COMPARISON:  None. FINDINGS: No focal airspace disease or pleural effusion. Borderline to mild cardiomegaly. No pneumothorax. IMPRESSION: No active disease. Borderline to mild cardiomegaly. Electronically Signed   By: Donavan Foil M.D.   On: 11/15/2020 22:24   ECHOCARDIOGRAM COMPLETE  Result Date: 11/16/2020    ECHOCARDIOGRAM REPORT   Patient Name:   BASEM YANNUZZI Date of Exam: 11/16/2020 Medical Rec #:  [id number]       Height:       71.0 in Accession #:    [id number]      Weight:       270.0 lb Date of Birth:  25-Jun-1956       BSA:          2.395 m Patient Age:    1 years        BP:           114/68 mmHg Patient Gender: M               HR:           86 bpm. Exam Location:  [facility] Procedure: 2D Echo, Cardiac Doppler, Color Doppler and Intracardiac            Opacification Agent Indications:     Atrial Fibrillation  143.91  History:         Patient has no prior history of Echocardiogram examinations.                  Risk Factors:Hypertension and Diabetes. Permanent Afib.  Sonographer:     Sherrie Sport RDCS (AE) Referring Phys:  [id number] Athena Masse Diagnosing Phys: Nelva Bush MD  Sonographer Comments: Suboptimal apical window and no subcostal window. IMPRESSIONS  1. Left ventricular ejection fraction, by estimation, is 35 to 40%. The left ventricle has moderately decreased function. The left ventricle demonstrates global hypokinesis. There is mild left ventricular hypertrophy. Left ventricular diastolic parameters are indeterminate.  2. Right ventricular systolic function is mildly reduced. The right ventricular size is normal. Tricuspid regurgitation signal is inadequate for assessing PA pressure.  3. Left atrial size was moderately dilated.  4. Right atrial size was mild to moderately dilated.  5. The mitral valve is normal in structure. Trivial mitral valve regurgitation. No evidence of mitral stenosis.  6. The aortic valve is tricuspid. There is mild thickening of the aortic valve. Aortic valve regurgitation is trivial. Mild aortic valve sclerosis is present, with no evidence of aortic valve stenosis. FINDINGS  Left Ventricle: Left ventricular ejection fraction, by estimation, is 35 to 40%. The left ventricle has moderately decreased function. The left ventricle demonstrates global hypokinesis. Definity contrast agent was given IV to delineate the left ventricular endocardial borders. The left ventricular internal cavity size was normal in size. There is mild left ventricular hypertrophy. Left ventricular diastolic parameters are indeterminate. Right Ventricle: The right ventricular size is normal. No increase in right ventricular wall thickness. Right ventricular systolic function is mildly reduced. Tricuspid regurgitation signal is inadequate for assessing PA pressure. Left Atrium: Left atrial size was moderately  dilated. Right Atrium: Right atrial size was mild to moderately dilated. Pericardium: The pericardium was not well visualized. Mitral Valve: The mitral valve is normal in structure. Trivial mitral valve regurgitation. No  evidence of mitral valve stenosis. Tricuspid Valve: The tricuspid valve is not well visualized. Tricuspid valve regurgitation is trivial. Aortic Valve: The aortic valve is tricuspid. There is mild thickening of the aortic valve. Aortic valve regurgitation is trivial. Mild aortic valve sclerosis is present, with no evidence of aortic valve stenosis. Aortic valve mean gradient measures 1.0 mmHg. Aortic valve peak gradient measures 1.7 mmHg. Aortic valve area, by VTI measures 3.31 cm. Pulmonic Valve: The pulmonic valve was normal in structure. Pulmonic valve regurgitation is not visualized. No evidence of pulmonic stenosis. Aorta: The aortic root is normal in size and structure. Pulmonary Artery: The pulmonary artery is of normal size. Venous: The inferior vena cava was not well visualized. IAS/Shunts: The interatrial septum was not well visualized.  LEFT VENTRICLE PLAX 2D LVIDd:         4.90 cm LVIDs:         4.21 cm LV PW:         1.22 cm LV IVS:        1.28 cm LVOT diam:     2.20 cm LV SV:         36 LV SV Index:   15 LVOT Area:     3.80 cm  LV Volumes (MOD) LV vol d, MOD A2C: 88.4 ml LV vol d, MOD A4C: 134.0 ml LV vol s, MOD A2C: 51.4 ml LV vol s, MOD A4C: 68.0 ml LV SV MOD A2C:     37.0 ml LV SV MOD A4C:     134.0 ml LV SV MOD BP:      50.7 ml RIGHT VENTRICLE RV Basal diam:  3.87 cm RV S prime:     10.40 cm/s LEFT ATRIUM              Index       RIGHT ATRIUM           Index LA diam:        4.70 cm  1.96 cm/m  RA Area:     25.30 cm LA Vol (A2C):   142.0 ml 59.28 ml/m RA Volume:   86.20 ml  35.99 ml/m LA Vol (A4C):   87.9 ml  36.70 ml/m LA Biplane Vol: 122.0 ml 50.93 ml/m  AORTIC VALVE                   PULMONIC VALVE AV Area (Vmax):    2.69 cm    PV Vmax:        0.60 m/s AV Area (Vmean):    2.60 cm    PV Peak grad:   1.4 mmHg AV Area (VTI):     3.31 cm    RVOT Peak grad: 1 mmHg AV Vmax:           64.60 cm/s AV Vmean:          46.300 cm/s AV VTI:            0.108 m AV Peak Grad:      1.7 mmHg AV Mean Grad:      1.0 mmHg LVOT Vmax:         45.70 cm/s LVOT Vmean:        31.700 cm/s LVOT VTI:          0.094 m LVOT/AV VTI ratio: 0.87  AORTA Ao Root diam: 3.40 cm MITRAL VALVE MV Area (PHT): 5.02 cm    SHUNTS MV Decel Time: 151 msec    Systemic VTI:  0.09 m MV  E velocity: 92.10 cm/s  Systemic Diam: 2.20 cm Nelva Bush MD Electronically signed by Nelva Bush MD Signature Date/Time: 11/16/2020/4:02:26 PM    Final        Subjective: pt c/o fatigue    Discharge Exam: Vitals:   11/18/20 0724 11/18/20 1136  BP: 119/80 (!) 116/56  Pulse: 67 83  Resp: 18 18  Temp: 98.2 F (36.8 C) 98.3 F (36.8 C)  SpO2: 97% 98%   Vitals:   11/18/20 0428 11/18/20 0458 11/18/20 0724 11/18/20 1136  BP: 111/65  119/80 (!) 116/56  Pulse: 68  67 83  Resp: 19  18 18   Temp: (!) 97.4 F (36.3 C)  98.2 F (36.8 C) 98.3 F (36.8 C)  TempSrc: Oral     SpO2: 93%  97% 98%  Weight:  122.8 kg    Height:        General: Pt is alert, awake, not in acute distress Cardiovascular:  S1/S2 +, no rubs, no gallops Respiratory: CTA bilaterally, no wheezing, no rhonchi Abdominal: Soft, NT, ND, bowel sounds + Extremities: no cyanosis    The results of significant diagnostics from this hospitalization (including imaging, microbiology, ancillary and laboratory) are listed below for reference.     Microbiology: Recent Results (from the past 240 hour(s))  Resp Panel by RT-PCR (Flu A&B, Covid) Nasopharyngeal Swab     Status: None   Collection Time: 11/15/20 10:55 PM   Specimen: Nasopharyngeal Swab; Nasopharyngeal(NP) swabs in vial transport medium  Result Value Ref Range Status   SARS Coronavirus 2 by RT PCR NEGATIVE NEGATIVE Final    Comment: (NOTE) SARS-CoV-2 target nucleic acids are NOT  DETECTED.  The SARS-CoV-2 RNA is generally detectable in upper respiratory specimens during the acute phase of infection. The lowest concentration of SARS-CoV-2 viral copies this assay can detect is 138 copies/mL. A negative result does not preclude SARS-Cov-2 infection and should not be used as the sole basis for treatment or other patient management decisions. A negative result may occur with  improper specimen collection/handling, submission of specimen other than nasopharyngeal swab, presence of viral mutation(s) within the areas targeted by this assay, and inadequate number of viral copies(<138 copies/mL). A negative result must be combined with clinical observations, patient history, and epidemiological information. The expected result is Negative.  Fact Sheet for Patients:  EntrepreneurPulse.com.au  Fact Sheet for Healthcare Providers:  IncredibleEmployment.be  This test is no t yet approved or cleared by the [place] FDA and  has been authorized for detection and/or diagnosis of SARS-CoV-2 by FDA under an Emergency Use Authorization (EUA). This EUA will remain  in effect (meaning this test can be used) for the duration of the COVID-19 declaration under Section 564(b)(1) of the Act, 21 U.S.C.section 360bbb-3(b)(1), unless the authorization is terminated  or revoked sooner.       Influenza A by PCR NEGATIVE NEGATIVE Final   Influenza B by PCR NEGATIVE NEGATIVE Final    Comment: (NOTE) The Xpert Xpress SARS-CoV-2/FLU/RSV plus assay is intended as an aid in the diagnosis of influenza from Nasopharyngeal swab specimens and should not be used as a sole basis for treatment. Nasal washings and aspirates are unacceptable for Xpert Xpress SARS-CoV-2/FLU/RSV testing.  Fact Sheet for Patients: EntrepreneurPulse.com.au  Fact Sheet for Healthcare Providers: IncredibleEmployment.be  This test is not yet  approved or cleared by the [place] FDA and has been authorized for detection and/or diagnosis of SARS-CoV-2 by FDA under an Emergency Use Authorization (EUA). This EUA will remain in  effect (meaning this test can be used) for the duration of the COVID-19 declaration under Section 564(b)(1) of the Act, 21 U.S.C. section 360bbb-3(b)(1), unless the authorization is terminated or revoked.  Performed at [facility], [address] [place], [place] [address]      Labs: BNP (last 3 results) Recent Labs    11/16/20 0641  BNP 401.0*   Basic Metabolic Panel: Recent Labs  Lab 11/15/20 2155 11/16/20 0641 11/17/20 0248 11/18/20 0251  NA 135 137 133* 136  K 3.9 4.2 4.1 4.4  CL 100 105 103 105  CO2 21* 22 21* 23  GLUCOSE 282* 244* 217* 185*  BUN 41* 36* 34* 42*  CREATININE 2.07* 1.79* 1.94* 2.17*  CALCIUM 9.2 8.4* 8.6* 8.8*  MG  --  2.1 2.1  --    Liver Function Tests: No results for input(s): AST, ALT, ALKPHOS, BILITOT, PROT, ALBUMIN in the last 168 hours. No results for input(s): LIPASE, AMYLASE in the last 168 hours. No results for input(s): AMMONIA in the last 168 hours. CBC: Recent Labs  Lab 11/15/20 2155 11/16/20 0641 11/17/20 0248 11/18/20 0251  WBC 13.3* 8.9 10.9* 9.7  NEUTROABS 10.7*  --  8.2*  --   HGB 16.2 14.0 14.3 13.4  HCT 50.5 44.6 44.3 44.0  MCV 82.7 83.2 83.3 84.8  PLT 280 225 232 238   Cardiac Enzymes: No results for input(s): CKTOTAL, CKMB, CKMBINDEX, TROPONINI in the last 168 hours. BNP: Invalid input(s): POCBNP CBG: Recent Labs  Lab 11/17/20 1211 11/17/20 1640 11/17/20 2126 11/18/20 0723 11/18/20 1135  GLUCAP 133* 180* 206* 169* 230*   D-Dimer No results for input(s): DDIMER in the last 72 hours. Hgb A1c Recent Labs    11/16/20 0641  HGBA1C 10.3*   Lipid Profile Recent Labs    11/16/20 0641  CHOL 148  HDL 23*  LDLCALC 95  TRIG 152*  CHOLHDL 6.4   Thyroid function studies Recent Labs    11/16/20 0641  11/17/20 1208  TSH 10.115*  --   T3FREE  --  2.7   Anemia work up No results for input(s): VITAMINB12, FOLATE, FERRITIN, TIBC, IRON, RETICCTPCT in the last 72 hours. Urinalysis No results found for: COLORURINE, APPEARANCEUR, [place], [place], GLUCOSEU, [place], [place], [place], PROTEINUR, UROBILINOGEN, NITRITE, LEUKOCYTESUR Sepsis Labs Invalid input(s): PROCALCITONIN,  WBC,  LACTICIDVEN Microbiology Recent Results (from the past 240 hour(s))  Resp Panel by RT-PCR (Flu A&B, Covid) Nasopharyngeal Swab     Status: None   Collection Time: 11/15/20 10:55 PM   Specimen: Nasopharyngeal Swab; Nasopharyngeal(NP) swabs in vial transport medium  Result Value Ref Range Status   SARS Coronavirus 2 by RT PCR NEGATIVE NEGATIVE Final    Comment: (NOTE) SARS-CoV-2 target nucleic acids are NOT DETECTED.  The SARS-CoV-2 RNA is generally detectable in upper respiratory specimens during the acute phase of infection. The lowest concentration of SARS-CoV-2 viral copies this assay can detect is 138 copies/mL. A negative result does not preclude SARS-Cov-2 infection and should not be used as the sole basis for treatment or other patient management decisions. A negative result may occur with  improper specimen collection/handling, submission of specimen other than nasopharyngeal swab, presence of viral mutation(s) within the areas targeted by this assay, and inadequate number of viral copies(<138 copies/mL). A negative result must be combined with clinical observations, patient history, and epidemiological information. The expected result is Negative.  Fact Sheet for Patients:  EntrepreneurPulse.com.au  Fact Sheet for Healthcare Providers:  IncredibleEmployment.be  This test is no  t yet approved or cleared by the [place] and  has been authorized for detection and/or diagnosis of SARS-CoV-2 by FDA under an Emergency Use Authorization (EUA). This EUA  will remain  in effect (meaning this test can be used) for the duration of the COVID-19 declaration under Section 564(b)(1) of the Act, 21 U.S.C.section 360bbb-3(b)(1), unless the authorization is terminated  or revoked sooner.       Influenza A by PCR NEGATIVE NEGATIVE Final   Influenza B by PCR NEGATIVE NEGATIVE Final    Comment: (NOTE) The Xpert Xpress SARS-CoV-2/FLU/RSV plus assay is intended as an aid in the diagnosis of influenza from Nasopharyngeal swab specimens and should not be used as a sole basis for treatment. Nasal washings and aspirates are unacceptable for Xpert Xpress SARS-CoV-2/FLU/RSV testing.  Fact Sheet for Patients: EntrepreneurPulse.com.au  Fact Sheet for Healthcare Providers: IncredibleEmployment.be  This test is not yet approved or cleared by the [place] FDA and has been authorized for detection and/or diagnosis of SARS-CoV-2 by FDA under an Emergency Use Authorization (EUA). This EUA will remain in effect (meaning this test can be used) for the duration of the COVID-19 declaration under Section 564(b)(1) of the Act, 21 U.S.C. section 360bbb-3(b)(1), unless the authorization is terminated or revoked.  Performed at [facility], [address], [place], [place] [address]      Time coordinating discharge: Over 30 minutes  SIGNED:   Wyvonnia Dusky, MD  [facility] 11/18/2020, 3:11 PM Pager   If 7PM-7AM, please contact night-coverage

## 2020-11-18 NOTE — Telephone Encounter (Signed)
Spoke with patient and reviewed provide request for follow up appointment and repeat labs. Scheduled him to come next Friday to see Christell Faith PA-C and to come the day before at the [facility] to have repeat labs done. He verbalized understanding of all instructions, was agreeable with plan, and had no further questions at this time.

## 2020-11-18 NOTE — Discharge Instructions (Signed)

## 2020-11-18 NOTE — Telephone Encounter (Signed)
-----   Message from Rise Mu, PA-C sent at 11/18/2020 10:41 AM EST ----- Can you get him set up for hospital follow up with Dr. Saunders Revel in 1-2 weeks. Needs to be an early AM appointment as he works 3rd shift. Pam, [place] [facility] [place]. Thanks!

## 2020-11-22 ENCOUNTER — Other Ambulatory Visit: Payer: Self-pay | Admitting: Physician Assistant

## 2020-11-22 MED ORDER — ISOSORBIDE MONONITRATE ER 30 MG PO TB24: 30.0000 mg | ORAL_TABLET | Freq: Every day | ORAL | 0 refills | Status: DC

## 2020-11-22 MED ORDER — FUROSEMIDE 20 MG PO TABS: 20.0000 mg | ORAL_TABLET | ORAL | 0 refills | Status: DC

## 2020-11-22 MED ORDER — POTASSIUM CHLORIDE CRYS ER 20 MEQ PO TBCR: 20.0000 meq | EXTENDED_RELEASE_TABLET | Freq: Every day | ORAL | 0 refills | Status: DC

## 2020-11-22 MED ORDER — APIXABAN 5 MG PO TABS: 5.0000 mg | ORAL_TABLET | Freq: Two times a day (BID) | ORAL | 6 refills | Status: DC

## 2020-11-22 NOTE — Telephone Encounter (Signed)
*  STAT* If patient is at the pharmacy, call can be transferred to refill team.   1. Which medications need to be refilled? (please list name of each medication and dose if known) eliquis 5 mg, lasix 20 mg, imdur 30 mg, potassium 72meq  2. Which pharmacy/location (including street and city if local pharmacy) is medication to be sent to?[facility] in [place]  3. Do they need a 30 day or 90 day supply? 4  Were not refilled while in hospital but patient is completely out

## 2020-11-22 NOTE — Telephone Encounter (Signed)
Pt's age 65, wt 122.8 kg, SCr 2.17, CrCl 58.16. Pt was recently hospitalized and has f/u w RD this month.

## 2020-11-22 NOTE — Progress Notes (Signed)
Cardiology Office Note    Date:  11/26/2020   ID:  Joshua Landry, DOB [birth year], MRN [id number]  PCP:  Patient, No Pcp Per  Cardiologist:  Nelva Bush, MD  Electrophysiologist:  None   Chief Complaint: Hospital follow up  History of Present Illness:   Joshua Landry is a 65 y.o. male with history of CAD status post PCI x2 to the RCA in 2011, permanent A. fib, HFrEF, pulmonary hypertension, CKD stage III, DM2, prostate cancer, HTN, HLD, morbid obesity, and OSA not on CPAP who presents for hospital follow-up after recent admission to [facility] from 12/28 through 12/30 for persistent A. fib with RVR, and acute on chronic HFrEF in the setting of running out of medications.  He was admitted to the hospital in 01/2010 with an NSTEMI and underwent PCI/BMS to the proximal RCA.  He underwent repeat cath in 05/2010 and was found to have significant ISR within the RCA stent and underwent PCI/DES at that time.  He was admitted in 11/2010 with another NSTEMI with subsequent cath demonstrating patent RCA stent with a possible thrombus in the PL branch and otherwise nonobstructive disease.    Echo in 12/2014 showed an EF of 20 to 25%, mildly dilated LV cavity size, mild concentric LVH, mildly dilated left atrium, mild mitral regurgitation, small right-sided pleural effusion, and a dilated IVC.  When compared to prior echo there has been a significant decline in his EF.  In this setting he underwent cath 12/2014 that showed severe small diagonal branch mid stenosis with mild ISR of the RCA stent with medical management advised.  Echo from 2018 showed an EF of 55 to 60%, mildly increased LV septal wall thickness with concentric LVH, mildly dilated RV, mild to moderate right atrium dilatation, mildly dilated left atrium measuring 45 mm, mild dilatation of the ascending aorta measuring 3.6 cm.  Review of EKG reports in care everywhere shows his last documented time in sinus rhythm was 2012.  He has indicated his  former cardiologist had previously discussed rhythm control strategy though due to his multiple comorbid conditions rate control strategy was decided upon.  He has been lost to follow-up from his primary cardiology group in [place], [place] since 07/2019 in the setting of recently changed jobs and inability to afford insurance during this transition.  In this setting he ran out of his medications for approximately 4 weeks prior to his presentation to [facility] on 11/16/2020 with increased shortness of breath and bilateral lower extremity swelling.  Upon his arrival he was noted to be volume overloaded and in A. fib with RVR.  Initial high-sensitivity troponin 83 with a delta of 65.  BNP 244.  Symptoms improved with gentle IV diuresis and rate control.  Echo showed an EF of 35 to 40%, global hypokinesis, indeterminate LV diastolic function parameters, mildly reduced RV systolic function with normal RV cavity size, moderately dilated left atrium, mildly to moderately dilated right atrium, trivial mitral regurgitation, trivial aortic insufficiency, mild aortic valve sclerosis without evidence of stenosis.  He underwent Lexiscan MPI to evaluate for high risk ischemia and LHC was deferred at his request given his comorbid conditions including CKD as indicated he would not want to be on dialysis.  Nuclear stress test showed no evidence of ischemia and was an intermediate risk study due to reduced LV systolic function.  Prior to discharge, medications were optimized as tolerated.  He comes in doing well from a cardiac perspective.  Since his hospital discharge  he has not had any further chest pain.  His breathing and cough continue to improve.  He does note some mild ankle edema which is typically worse after working a long shift and improved in the mornings.  He reports his weight has been stable around 270 pounds which is his dry weight per his report.  Blood pressure at home has ranged from the 063K to 160F systolic.   He does report he did not have all of his medications refilled at hospital discharge and had to contact our office to get these leading to a delay in his medical therapy.  He denies any falls, hematochezia, melena, hemoptysis, hematemesis, or hematuria.  He requests a referral to urology given his history of prostate cancer.  He also needs the number for [facility] to get established with a PCP.   Labs independently reviewed: 11/2020 - potassium 4.2, BUN 46, serum creatinine 2.38 10/2020 - Hgb 13.4, PLT 238, TSH 10.115, free T3 normal, free T4 normal, magnesium 2.1 TC 148, TG 152, HDL 23, LDL 95, A1c 10.3  Past Medical History:  Diagnosis Date  . (HFpEF) heart failure with preserved ejection fraction ([place])   . CAD (coronary artery disease)   . Chronic kidney disease (CKD), stage III (moderate) (HCC)   . Diabetes mellitus without complication ([place])   . Essential hypertension   . Hyperlipidemia   . Morbid obesity ([place])   . OSA (obstructive sleep apnea)   . Permanent atrial fibrillation ([place])   . Prostate CA [facility])     Past Surgical History:  Procedure Laterality Date  . WRIST SURGERY Right     Current Medications: Current Meds  Medication Sig  . aspirin 81 MG EC tablet Take 81 mg by mouth daily.  . Cholecalciferol (VITAMIN D3) 10 MCG (400 UNIT) CAPS Take 400 Units by mouth daily.  . empagliflozin (JARDIANCE) 25 MG TABS tablet Take 25 mg by mouth daily.  Marland Kitchen levothyroxine (SYNTHROID) 50 MCG tablet Take 1 tablet (50 mcg total) by mouth daily at 6 (six) AM.  . metFORMIN (GLUCOPHAGE) 1000 MG tablet Take 1,000 mg by mouth 2 (two) times daily.  . metoprolol succinate (TOPROL-XL) 50 MG 24 hr tablet Take 1 tablet (50 mg total) by mouth every evening. Take with or immediately following a meal.  . tamsulosin (FLOMAX) 0.4 MG CAPS capsule Take 0.4 mg by mouth daily.  . [DISCONTINUED] apixaban (ELIQUIS) 5 MG TABS tablet Take 1 tablet (5 mg total) by mouth 2 (two) times daily.  . [DISCONTINUED]  atorvastatin (LIPITOR) 80 MG tablet Take 80 mg by mouth at bedtime.  . [DISCONTINUED] furosemide (LASIX) 20 MG tablet Take 1 tablet (20 mg total) by mouth See admin instructions. Take 20 mg every day and 1 tablet if you have a 5 lb weight gain in 1 day.  . [DISCONTINUED] isosorbide mononitrate (IMDUR) 30 MG 24 hr tablet Take 1 tablet (30 mg total) by mouth daily.  . [DISCONTINUED] losartan (COZAAR) 25 MG tablet Take 0.5 tablets (12.5 mg total) by mouth daily.  . [DISCONTINUED] metoprolol succinate (TOPROL-XL) 100 MG 24 hr tablet Take 1 tablet (100 mg total) by mouth daily. Take with or immediately following a meal.  . [DISCONTINUED] nitroGLYCERIN (NITROSTAT) 0.4 MG SL tablet Place 0.4 mg under the tongue See admin instructions. Place 1 tablet under the tongue every 5 minutes as needed for chest pain. Max of 3 doses in 15 minutes.  . [DISCONTINUED] potassium chloride SA (KLOR-CON) 20 MEQ tablet Take 1 tablet (20 mEq  total) by mouth daily.    Allergies:   Prasugrel   Social History   Socioeconomic History  . Marital status: Soil scientist    Spouse name: Not on file  . Number of children: Not on file  . Years of education: Not on file  . Highest education level: Not on file  Occupational History  . Not on file  Tobacco Use  . Smoking status: Never Smoker  . Smokeless tobacco: Current User    Types: Snuff  Vaping Use  . Vaping Use: Never used  Substance and Sexual Activity  . Alcohol use: Yes    Comment: social-once a week  . Drug use: Never  . Sexual activity: Not Currently  Other Topics Concern  . Not on file  Social History Narrative  . Not on file   Social Determinants of Health   Financial Resource Strain: Not on file  Food Insecurity: Not on file  Transportation Needs: Not on file  Physical Activity: Not on file  Stress: Not on file  Social Connections: Not on file     Family History:  The patient's family history is not on file.  ROS:   Review of Systems   Constitutional: Negative for chills, diaphoresis, fever, malaise/fatigue and weight loss.  HENT: Negative for congestion.   Eyes: Negative for discharge and redness.  Respiratory: Positive for cough and shortness of breath. Negative for hemoptysis, sputum production and wheezing.        Improving cough and shortness of breath  Cardiovascular: Negative for chest pain, palpitations, orthopnea, claudication, leg swelling and PND.  Gastrointestinal: Negative for abdominal pain, blood in stool, heartburn, melena, nausea and vomiting.  Genitourinary: Negative for hematuria.  Musculoskeletal: Negative for falls and myalgias.  Skin: Negative for rash.  Neurological: Negative for dizziness, tingling, tremors, sensory change, speech change, focal weakness, loss of consciousness and weakness.  Endo/Heme/Allergies: Does not bruise/bleed easily.  Psychiatric/Behavioral: Negative for substance abuse. The patient is not nervous/anxious.   All other systems reviewed and are negative.    EKGs/Labs/Other Studies Reviewed:    Studies reviewed were summarized above. The additional studies were reviewed today:  2D echo 12/21/2014 [facility] Adie): Summary:  1. Left ventricle septal thickness is mildly increased. --with mild  concentric LVH  2. The left ventricular chamber size is mildly dilated.  3. Severe global hypokinesis of the left ventricle is observed.  4. The EF is estimated at 20-25%.  5. The left atrium is mildly dilated.  6. There is mild mitral regurgitation observed.  7. There is a small pleural effusion. --right sided  8. The inferior vena cava appears dilated suggesting elevated central  venous pressure. --mild increased  9. Since prior outside echo, there is significant decline in LVEF  10. Incidential gallstone noted  __________  LHC 12/22/2014 ([facility]): PRESSURES:  RA Pressure: 31 mean mmHG  RV Pressure: 62 mmHG   RVEDP:  32 mmHG  PA Pressure: 64/48 mmHG   Mean PA pressure:  53 mmHG  PCWP: Mean 44 mmHG no V wave  AO pressure: 125/101 mmHG   Mean AO pressure:  099 mmHG  LV systolic pressure: 22 mmHG   LVEDP: 40 mmHG   OXYGEN SATURATIONS:  RA: 49 %  PA:  49 %  Femoral:  94 %  Other: N/A   CARDIAC OUTPUT:  Thermodilution cardiac output:  4.4 L/min  Thermodilution cardiac index:  1.9 L/min/m2  Fickcardiac output:  3.2 L/min  Fick cardiac index:  1.3 L/min/m2    CORONARY  ANATOMY:  Left Main: This vessel bifurcated into LAD, small ramus branch, and  moderately sized left circumflex vessel. The left main artery had no  stenosis   Left anterior descending artery: This vessel gave rise to a long 2 mm  small-to medium-sized diagonal branch. The diagonal branch had a 99% mid  stenosis that was not the culprit of the patient's profound  cardiomyopathy. The proximal LAD was patent. The mid LAD had a  moderately long 30-50% stenosis.   Left circumflex artery: This vessel and its large mid obtuse marginal  branch and medium-sized distal obtuse marginal branch was free of disease.   Right coronary artery: This vessel gave rise to a long proximal vessel  stent with mild in-stent restenosis. The remainder of the RCA and its  medium-sized PDA and small-to medium-sized posterior lateral branch were  free of disease.    LEFT VENTRICULOGRAM: This was performed by hand injection to spare dye.   There is underfilling of the ventricle showing severe LV chamber  enlargement with severe global hypokinesis and left ventricular ejection  fraction less than 20%. There was inability to use assess for mitral  regurgitation or aortic size.   CONCLUSIONS:  1. Severe small vessel disease of a single branch otherwise mild LAD  disease and very mild in-stent restenosis of the right coronary artery;  the cardiomyopathy is out of proportion to severity of coronary disease  2. Severe LV systolic dysfunction  3. Profound volume overload  by right heart catheterization with marginal  cardiac index    PLAN:   1. IV Lasix diuresis  2. Continue to advance carvedilol and ACE inhibitor therapy  ___________  2D echo 10/23/2017 [facility] Adie): Summary:  1. Left ventricle septal thickness is mildly increased. --with  concentric LVH  2. The EF is estimated at 55-60%.  3. Abnormal diastolic filling pattern consistent with underlying atrial  fibrillation was observed. --with heart rate 66 to 94 bpm  4. The right ventricular cavity size is mildly enlarged.  5. The right atrium is mild to moderately dilated.   6. The inferior vena cava is not visualized.  7. There is mild dilatation of the ascending aorta. --at 3.6 cm   Recommendation:  1. Consider non compilance of OSA therapy contributing as an etiology  for recent fluid retention symptoms __________  2D echo 11/16/2020: 1. Left ventricular ejection fraction, by estimation, is 35 to 40%. The  left ventricle has moderately decreased function. The left ventricle  demonstrates global hypokinesis. There is mild left ventricular  hypertrophy. Left ventricular diastolic  parameters are indeterminate.  2. Right ventricular systolic function is mildly reduced. The right  ventricular size is normal. Tricuspid regurgitation signal is inadequate  for assessing PA pressure.  3. Left atrial size was moderately dilated.  4. Right atrial size was mild to moderately dilated.  5. The mitral valve is normal in structure. Trivial mitral valve  regurgitation. No evidence of mitral stenosis.  6. The aortic valve is tricuspid. There is mild thickening of the aortic  valve. Aortic valve regurgitation is trivial. Mild aortic valve sclerosis  is present, with no evidence of aortic valve stenosis. __________  Carlton Adam MPI 11/17/2020:  There was no ST segment deviation noted during stress.  No T wave inversion was noted during stress.  This is an intermediate risk study due to reduced  ejection fraction.  The left ventricular ejection fraction is moderately decreased visually (35%). measured EF was 20%. correlation with echo advised  There was no  evidence for ischemia   EKG:  EKG is ordered today.  The EKG ordered today demonstrates A. fib, 97 bpm, left axis deviation, nonspecific lateral ST-T changes  Recent Labs: 11/16/2020: B Natriuretic Peptide 244.1; TSH 10.115 11/17/2020: Magnesium 2.1 11/18/2020: Hemoglobin 13.4; Platelets 238 11/25/2020: BUN 46; Creatinine, Ser 2.38; Potassium 4.2; Sodium 137  Recent Lipid Panel    Component Value Date/Time   CHOL 148 11/16/2020 0641   TRIG 152 (H) 11/16/2020 0641   HDL 23 (L) 11/16/2020 0641   CHOLHDL 6.4 11/16/2020 0641   VLDL 30 11/16/2020 0641   LDLCALC 95 11/16/2020 0641    PHYSICAL EXAM:    VS:  BP 110/70 (BP Location: Left Arm, Patient Position: Sitting, Cuff Size: Large)   Pulse 97   Ht 5\' 11"  (1.803 m)   Wt 270 lb (122.5 kg)   SpO2 97%   BMI 37.66 kg/m   BMI: Body mass index is 37.66 kg/m.  Physical Exam Vitals reviewed.  Constitutional:      Appearance: He is well-developed and well-nourished.  HENT:     Head: Normocephalic and atraumatic.  Eyes:     General:        Right eye: No discharge.        Left eye: No discharge.  Neck:     Vascular: No JVD.  Cardiovascular:     Rate and Rhythm: Normal rate and regular rhythm.     Pulses: No midsystolic click and no opening snap.          Posterior tibial pulses are 2+ on the right side and 2+ on the left side.     Heart sounds: Normal heart sounds, S1 normal and S2 normal. Heart sounds not distant. No murmur heard. No friction rub.  Pulmonary:     Effort: Pulmonary effort is normal. No respiratory distress.     Breath sounds: Normal breath sounds. No decreased breath sounds, wheezing or rales.  Chest:     Chest wall: No tenderness.  Abdominal:     General: There is no distension.     Palpations: Abdomen is soft.     Tenderness: There is no  abdominal tenderness.  Musculoskeletal:     Cervical back: Normal range of motion.     Right lower leg: Edema present.     Left lower leg: Edema present.     Comments: Trace bilateral pretibial edema  Skin:    General: Skin is warm and dry.     Nails: There is no clubbing or cyanosis.  Neurological:     Mental Status: He is alert and oriented to person, place, and time.  Psychiatric:        Mood and Affect: Mood and affect normal.        Speech: Speech normal.        Behavior: Behavior normal.        Thought Content: Thought content normal.        Judgment: Judgment normal.     Wt Readings from Last 3 Encounters:  11/26/20 270 lb (122.5 kg)  11/18/20 270 lb 11.2 oz (122.8 kg)     ASSESSMENT & PLAN:   1. CAD involving the native coronary arteries without angina: He is doing well without any symptoms concerning for angina.  Recent Lexiscan MPI showed no evidence of scar or significant ischemia.  Continue secondary prevention and current medical therapy including aspirin, atorvastatin, Imdur, Toprol-XL, and losartan along with as needed SL NTG.  We will continue to optimize  medical therapy over the next couple of months followed by a repeat limited echo as outlined below to evaluate for improvement in his LV systolic function.  2. HFrEF: He appears euvolemic and well compensated with NYHA class II symptoms.  Add 50 mg every afternoon Toprol-XL with continuation of 100 mg in the morning.  With mild AKI in the setting of CKD we will decrease his furosemide and KCl to every other day.  Otherwise, he will continue Jardiance and losartan.  Underlying CKD precludes addition of MRA at this time.  Relative hypotension precludes transition from losartan to [facility].  We will continue to optimize his medications as labs,, patient tolerance, and vital signs allow.  We will plan to update a limited echo in approximately 3 months time on optimal GDMT and with adequately controlled ventricular rates in  the setting of permanent A. fib.  If at that time his cardiomyopathy persists we may need to pursue diagnostic R/LHC if renal function allows.  Otherwise, if his cardiomyopathy has improved it is suspected that his drop in EF was nonischemic and potentially tachycardia mediated in the setting of persistent A. fib with RVR.  CHF education discussed in detail.  3. Permanent A. Fib: Ventricular rates are reasonably controlled though we will increase his Toprol-XL with the addition of 50 mg every afternoon and continuation of 100 mg every morning.  Given his CHA2DS2-VASc of at least 5 he remains on Eliquis without symptoms concerning for bleeding and stable CBC as outlined above.  Given his last EKG showing sinus rhythm is dated 2012 it appears rhythm control strategy may not be an option and we will pursue rate control.  Overall, he is asymptomatic in A. fib.  4. Pulmonary hypertension: Likely in the setting of underlying OSA with obesity and possible OHS.  CPAP recommended.  5. CKD stage III: Overall reasonably stable.  He has been referred to PCP.  Decrease Lasix as outlined above.  6. HTN: Blood pressure is well controlled in the office today.  Continue medications as outlined above.  7. HLD: LDL 95 from 10/2020 after being off medications for approximately 4 weeks.  Currently on atorvastatin 80 mg daily.  Follow-up fasting lipid panel and LFT in approximately 8 weeks.  If LDL remains above goal of 70 at that time, we will plan to add ezetimibe 10 mg.  8. OSA: Compliance with CPAP recommended.  9. DM2: He has been given the number for [facility] to establish with PCP.  Currently on Metformin and Jardiance.  10. History of prostate cancer: He requests referral to urology to establish care, this was provided.  Disposition: F/u with Dr. Saunders Revel or an APP in 1 month.   Medication Adjustments/Labs and Tests Ordered: Current medicines are reviewed at length with the patient today.  Concerns regarding medicines  are outlined above. Medication changes, Labs and Tests ordered today are summarized above and listed in the Patient Instructions accessible in Encounters.   Signed, Christell Faith, PA-C 11/26/2020 8:27 AM     [facility] [address] [place] [address] [place], [place] [address] [phone]

## 2020-11-22 NOTE — Telephone Encounter (Signed)
Please review for refill on Eliquis 5 mg.  The patient was recently in the hospital at [facility]. The patient was followed by [facility] however, the patient has changed his care to our practice since the hospital discharge.

## 2020-11-25 ENCOUNTER — Other Ambulatory Visit
Admission: RE | Admit: 2020-11-25 | Discharge: 2020-11-25 | Disposition: A | Discharge status: Home / Self Care | Payer: Commercial Managed Care - PPO | Attending: Physician Assistant | Admitting: Physician Assistant

## 2020-11-25 DIAGNOSIS — I482 Chronic atrial fibrillation, unspecified: Secondary | ICD-10-CM

## 2020-11-25 LAB — BASIC METABOLIC PANEL
Anion gap: 10 (ref 5–15)
BUN: 46 mg/dL — ABNORMAL HIGH (ref 8–23)
CO2: 26 mmol/L (ref 22–32)
Calcium: 8.8 mg/dL — ABNORMAL LOW (ref 8.9–10.3)
Chloride: 101 mmol/L (ref 98–111)
Creatinine, Ser: 2.38 mg/dL — ABNORMAL HIGH (ref 0.61–1.24)
GFR, Estimated: 29 mL/min — ABNORMAL LOW (ref >60)
Glucose, Bld: 202 mg/dL — ABNORMAL HIGH (ref 70–99)
Potassium: 4.2 mmol/L (ref 3.5–5.1)
Sodium: 137 mmol/L (ref 135–145)

## 2020-11-26 ENCOUNTER — Encounter: Payer: Self-pay | Admitting: Physician Assistant

## 2020-11-26 ENCOUNTER — Other Ambulatory Visit: Payer: Self-pay

## 2020-11-26 ENCOUNTER — Ambulatory Visit (INDEPENDENT_AMBULATORY_CARE_PROVIDER_SITE_OTHER): Payer: Commercial Managed Care - PPO | Admitting: Physician Assistant

## 2020-11-26 VITALS — BP 110/70 | HR 97 | Ht 71.0 in | Wt 270.0 lb

## 2020-11-26 DIAGNOSIS — I482 Chronic atrial fibrillation, unspecified: Secondary | ICD-10-CM | POA: Diagnosis not present

## 2020-11-26 DIAGNOSIS — C61 Malignant neoplasm of prostate: Secondary | ICD-10-CM

## 2020-11-26 DIAGNOSIS — I2510 Atherosclerotic heart disease of native coronary artery without angina pectoris: Secondary | ICD-10-CM

## 2020-11-26 DIAGNOSIS — N183 Chronic kidney disease, stage 3 unspecified: Secondary | ICD-10-CM

## 2020-11-26 DIAGNOSIS — I5020 Unspecified systolic (congestive) heart failure: Secondary | ICD-10-CM

## 2020-11-26 DIAGNOSIS — G4733 Obstructive sleep apnea (adult) (pediatric): Secondary | ICD-10-CM

## 2020-11-26 DIAGNOSIS — I272 Pulmonary hypertension, unspecified: Secondary | ICD-10-CM

## 2020-11-26 DIAGNOSIS — E118 Type 2 diabetes mellitus with unspecified complications: Secondary | ICD-10-CM

## 2020-11-26 DIAGNOSIS — E785 Hyperlipidemia, unspecified: Secondary | ICD-10-CM

## 2020-11-26 DIAGNOSIS — I10 Essential (primary) hypertension: Secondary | ICD-10-CM

## 2020-11-26 MED ORDER — APIXABAN 5 MG PO TABS: 5.0000 mg | ORAL_TABLET | Freq: Two times a day (BID) | ORAL | 11 refills | Status: DC

## 2020-11-26 MED ORDER — FUROSEMIDE 20 MG PO TABS: 20.0000 mg | ORAL_TABLET | ORAL | 3 refills | Status: DC

## 2020-11-26 MED ORDER — METOPROLOL SUCCINATE ER 100 MG PO TB24: 100.0000 mg | ORAL_TABLET | Freq: Every day | ORAL | 3 refills | Status: DC

## 2020-11-26 MED ORDER — ISOSORBIDE MONONITRATE ER 30 MG PO TB24: 30.0000 mg | ORAL_TABLET | Freq: Every day | ORAL | 3 refills | Status: DC

## 2020-11-26 MED ORDER — LOSARTAN POTASSIUM 25 MG PO TABS
12.5000 mg | ORAL_TABLET | Freq: Every day | ORAL | 3 refills | Status: DC
Start: 2020-11-26 — End: 2021-10-04

## 2020-11-26 MED ORDER — NITROGLYCERIN 0.4 MG SL SUBL: 0.4000 mg | SUBLINGUAL_TABLET | SUBLINGUAL | 2 refills | Status: DC

## 2020-11-26 MED ORDER — METOPROLOL SUCCINATE ER 50 MG PO TB24: 50.0000 mg | ORAL_TABLET | Freq: Every evening | ORAL | 3 refills | Status: DC

## 2020-11-26 MED ORDER — ATORVASTATIN CALCIUM 80 MG PO TABS: 80.0000 mg | ORAL_TABLET | Freq: Every evening | ORAL | 3 refills | Status: DC

## 2020-11-26 MED ORDER — POTASSIUM CHLORIDE CRYS ER 20 MEQ PO TBCR: 20.0000 meq | EXTENDED_RELEASE_TABLET | ORAL | 3 refills | Status: DC

## 2020-11-26 NOTE — Patient Instructions (Addendum)
Medication Instructions:  Your physician has recommended you make the following change in your medication:  1. CHANGE Furosemide (Lasix) to 20 mg every other day 2. CHANGE Potassium 20 mEq to every other day 3. START Metoprolol 50 mg in the evening   *If you need a refill on your cardiac medications before your next appointment, please call your pharmacy*   Lab Work: None  If you have labs (blood work) drawn today and your tests are completely normal, you will receive your results only by: Marland Kitchen MyChart Message (if you have MyChart) OR . A paper copy in the mail If you have any lab test that is abnormal or we need to change your treatment, we will call you to review the results.   Testing/Procedures: None   Follow-Up: At [facility], you and your health needs are our priority.  As part of our continuing mission to provide you with exceptional heart care, we have created designated Provider Care Teams.  These Care Teams include your primary Cardiologist (physician) and Advanced Practice Providers (APPs -  Physician Assistants and Nurse Practitioners) who all work together to provide you with the care you need, when you need it.  We recommend signing up for the patient portal called "MyChart".  Sign up information is provided on this After Visit Summary.  MyChart is used to connect with patients for Virtual Visits (Telemedicine).  Patients are able to view lab/test results, encounter notes, upcoming appointments, etc.  Non-urgent messages can be sent to your provider as well.   To learn more about what you can do with MyChart, go to [email or web address].    Your next appointment:   1 month(s)  The format for your next appointment:   In Person  Provider:   You may see Nelva Bush, MD or one of the following Advanced Practice Providers on your designated Care Team:    Murray Hodgkins, NP  Christell Faith, PA-C  Marrianne Mood, PA-C  Cadence [place], [place]  Laurann Montana,  NP   Referral placed for urology.  [place] [phone]  [facility] for Primary Care Provider Call [phone]

## 2020-12-10 ENCOUNTER — Encounter: Payer: Self-pay | Admitting: Urology

## 2020-12-10 ENCOUNTER — Other Ambulatory Visit: Payer: Self-pay

## 2020-12-10 ENCOUNTER — Ambulatory Visit: Payer: Commercial Managed Care - PPO | Admitting: Urology

## 2020-12-10 VITALS — BP 151/94 | HR 87 | Ht 71.0 in | Wt 275.0 lb

## 2020-12-10 DIAGNOSIS — C61 Malignant neoplasm of prostate: Secondary | ICD-10-CM | POA: Diagnosis not present

## 2020-12-10 DIAGNOSIS — R399 Unspecified symptoms and signs involving the genitourinary system: Secondary | ICD-10-CM | POA: Diagnosis not present

## 2020-12-10 DIAGNOSIS — N189 Chronic kidney disease, unspecified: Secondary | ICD-10-CM | POA: Diagnosis not present

## 2020-12-10 MED ORDER — TAMSULOSIN HCL 0.4 MG PO CAPS: 0.4000 mg | ORAL_CAPSULE | Freq: Every day | ORAL | 5 refills | Status: DC

## 2020-12-10 NOTE — Progress Notes (Signed)
12/10/2020 8:18 AM   Joshua Landry 11/05/1956 [id number]  Referring provider: Rise Mu, PA-C [place] [facility] [place],   [address]  Chief Complaint  Patient presents with  . Prostate Cancer    Urologic history: 1. cT2 high risk prostate cancer  -Diagnosed May 2018; PSA 25.5; biopsy Gleason 4+49/12 cores -Negative bone scan; prostate MRI 25 cc gland; 1.2 cm nonspecific right external iliac lymph node -Treated IMRT + ADT -IMRT completed 08/14/2017 -Leuprolide completed July 2019   HPI: Joshua Landry is a 65 y.o. male who presents to establish local urologic care.   Previous care [facility], Dr. Noemi Chapel with last visit March 2021  Last PSA 01/14/2020 0.3  PSA nadir 0.20 November 2018  Bothersome LUTS with urinary frequency, urgency and urge incontinence  On tamsulosin and wears depends  States he has not been tried on any other medications for his voiding symptoms  Bladder scan residuals have been normal  Denies dysuria, gross hematuria  Denies flank, abdominal or pelvic pain  Renal ultrasound 05/2018 left upper pole simple cyst   PMH: Past Medical History:  Diagnosis Date  . (HFpEF) heart failure with preserved ejection fraction ([place])   . CAD (coronary artery disease)   . Chronic kidney disease (CKD), stage III (moderate) ([facility])   . Diabetes mellitus without complication ([place])   . Essential hypertension   . Hyperlipidemia   . Morbid obesity ([place])   . OSA (obstructive sleep apnea)   . Permanent atrial fibrillation ([place])   . Prostate CA [facility])     Surgical History: Past Surgical History:  Procedure Laterality Date  . WRIST SURGERY Right     Home Medications:  Allergies as of 12/10/2020      Reactions   Prasugrel Rash   Rash  Rash  Rash       Medication List       Accurate as of December 10, 2020  8:18 AM. If you have any questions, ask your nurse or doctor.        apixaban 5 MG Tabs tablet Commonly known as:  ELIQUIS Take 1 tablet (5 mg total) by mouth 2 (two) times daily.   aspirin 81 MG EC tablet Take 81 mg by mouth daily.   atorvastatin 80 MG tablet Commonly known as: LIPITOR Take 1 tablet (80 mg total) by mouth at bedtime.   empagliflozin 25 MG Tabs tablet Commonly known as: JARDIANCE Take 25 mg by mouth daily.   furosemide 20 MG tablet Commonly known as: LASIX Take 1 tablet (20 mg total) by mouth every other day. Take 20 mg every day and 1 tablet if you have a 5 lb weight gain in 1 day.   isosorbide mononitrate 30 MG 24 hr tablet Commonly known as: IMDUR Take 1 tablet (30 mg total) by mouth daily.   levothyroxine 50 MCG tablet Commonly known as: SYNTHROID Take 1 tablet (50 mcg total) by mouth daily at 6 (six) AM.   losartan 25 MG tablet Commonly known as: COZAAR Take 0.5 tablets (12.5 mg total) by mouth daily.   metFORMIN 1000 MG tablet Commonly known as: GLUCOPHAGE Take 1,000 mg by mouth 2 (two) times daily.   metoprolol succinate 100 MG 24 hr tablet Commonly known as: TOPROL-XL Take 1 tablet (100 mg total) by mouth daily. Take with or immediately following a meal.   metoprolol succinate 50 MG 24 hr tablet Commonly known as: TOPROL-XL Take 1 tablet (50 mg total) by mouth every evening.  Take with or immediately following a meal.   nitroGLYCERIN 0.4 MG SL tablet Commonly known as: NITROSTAT Place 1 tablet (0.4 mg total) under the tongue See admin instructions. Place 1 tablet under the tongue every 5 minutes as needed for chest pain. Max of 3 doses in 15 minutes.   potassium chloride SA 20 MEQ tablet Commonly known as: KLOR-CON Take 1 tablet (20 mEq total) by mouth every other day.   tamsulosin 0.4 MG Caps capsule Commonly known as: FLOMAX Take 0.4 mg by mouth daily.   Vitamin D3 10 MCG (400 UNIT) Caps Take 400 Units by mouth daily.       Allergies:  Allergies  Allergen Reactions  . Prasugrel Rash    Rash  Rash  Rash      Family History: History  reviewed. No pertinent family history.  Social History:  reports that he has never smoked. His smokeless tobacco use includes snuff. He reports current alcohol use. He reports that he does not use drugs.   Physical Exam: BP (!) 151/94   Pulse 87   Ht 5\' 11"  (1.803 m)   Wt 275 lb (124.7 kg)   BMI 38.35 kg/m   Constitutional:  Alert, No acute distress. HEENT: [place] AT, moist mucus membranes.  Trachea midline, no masses. Cardiovascular: No clubbing, cyanosis, or edema. Respiratory: Normal respiratory effort, no increased work of breathing. Skin: No rashes, bruises or suspicious lesions. Neurologic: Grossly intact, no focal deficits, moving all 4 extremities. Psychiatric: Normal mood and affect.   Assessment & Plan:    1. cT2 high risk prostate cancer  Status post IMRT + ADT  PSA drawn today  If stable follow-up 6 months  2. Lower urinary tract symptoms  Storage related voiding symptoms which occurred after IMRT  Low residuals and initial prostate volume small at 25 cc  Trial Myrbetriq 50 mg daily-samples given and he will call back regarding efficacy  Tamsulosin refilled  3.  Chronic kidney disease  States he was referred to nephrology by his cardiologist however that particular nephrologist had retired and request local referral  Last creatinine in 2020 was 1.84  BUN/creatinine drawn today  Nephrology referral placed   Abbie Sons, [place] [address] [place], [place] [address] [phone]

## 2020-12-11 LAB — BUN+CREAT
BUN/Creatinine Ratio: 16 (ref 10–24)
BUN: 37 mg/dL — ABNORMAL HIGH (ref 8–27)
Creatinine, Ser: 2.34 mg/dL — ABNORMAL HIGH (ref 0.76–1.27)
GFR calc Af Amer: 32 mL/min/{1.73_m2} — ABNORMAL LOW (ref >59)
GFR calc non Af Amer: 28 mL/min/{1.73_m2} — ABNORMAL LOW (ref >59)

## 2020-12-11 LAB — PSA: Prostate Specific Ag, Serum: 0.8 ng/mL (ref 0.0–4.0)

## 2020-12-16 NOTE — Progress Notes (Signed)
Cardiology Office Note    Date:  12/24/2020   ID:  Joshua Landry, DOB [birth year], MRN [id number]  PCP:  Joshua Phi, FNP  Cardiologist:  Joshua Bush, MD  Electrophysiologist:  None   Chief Complaint: Follow up  History of Present Illness:   Joshua Landry is a 65 y.o. male with history of CAD status post PCI x2 to the RCA in 2011, permanent A. fib, HFrEF, pulmonary hypertension, CKD stage III, DM2, prostate cancer, HTN, HLD, morbid obesity, and OSA not on CPAPwho presents for hospital follow-up of his CAD, Afib, and HFrEF.  Hewas admitted to the hospital in 01/2010 with an NSTEMI and underwent PCI/BMS to the proximal RCA. He underwent repeat cath in 7/2011and was found to have significant ISR within the RCA stent and underwent PCI/DES at that time. He was admitted in 11/2010 with another NSTEMI with subsequent cath demonstrating patent RCA stent with a possible thrombus in the PL branch and otherwise nonobstructive disease.  Echo in 12/2014 showed an EF of 20 to 25%, mildly dilated LV cavity size, mild concentric LVH, mildly dilated left atrium, mild mitral regurgitation, small right-sided pleural effusion, and a dilated IVC.  When compared to prior echo there had been a significant decline in his EF.  In this setting, he underwent cath 12/2014 that showed severe small diagonal branch mid stenosis with mild ISR of the RCA stent with medical management advised. Echo from 2018 showed an EF of 55 to 60%, mildly increased LV septal wall thickness with concentric LVH, mildly dilated RV, mild to moderate right atrium dilatation, mildly dilated left atrium measuring 45 mm, mild dilatation of the ascending aorta measuring 3.6 cm.Review of EKG reports in care everywhere showed his last documented time in sinus rhythm was 2012. He has indicated his former cardiologist had previously discussed rhythm control strategy though due to his multiple comorbid conditions rate control strategy  was decided upon.   He had been lost to follow-up from his primary cardiology group in [place], [place] since 07/2019, until his recent admission to [facility] in 12/021, in the setting of recently changed jobs and inability to afford insurance during this transition.  In this setting he ran out of his medications for approximately 4 weeks prior to his presentation to [facility] on 11/16/2020 with increased shortness of breath and bilateral lower extremity swelling.  Upon his arrival he was noted to be volume overloaded and in A. fib with RVR.  Initial high-sensitivity troponin 83 with a delta of 65.  BNP 244.  Symptoms improved with gentle IV diuresis and rate control.  Echo showed an EF of 35 to 40%, global hypokinesis, indeterminate LV diastolic function parameters, mildly reduced RV systolic function with normal RV cavity size, moderately dilated left atrium, mildly to moderately dilated right atrium, trivial mitral regurgitation, trivial aortic insufficiency, mild aortic valve sclerosis without evidence of stenosis.  He underwent Lexiscan MPI to evaluate for high risk ischemia and LHC was deferred at his request given his comorbid conditions including CKD as indicated he would not want to be on dialysis.  Nuclear stress test showed no evidence of ischemia and was an intermediate risk study due to reduced LV systolic function.  Prior to discharge, medications were optimized as tolerated.  He was seen in hospital follow up on 11/26/2020 and was doing well from a cardiac perspective. 50 mg of Toprol XL was added in the PM, to further optimize his regimen, and his Lasix was decreased to every other  day given acute on CKD.   He comes in doing well from a cardiac perspective and does not have any symptoms concerning for angina. His breathing is overall stable, though he does note an occasional sensation to take an intermittent deep inhalation. These sensations occur at rest. His weight has been stable at 270 to 271 pounds  at home by his scale. BP has been well controlled in the 076A mmHg systolic. His lower extremity swelling is stable and more pronounced after a long shift at work. He has stable 2-pillow orthopnea. He does watch his salt intake and drinks right at 2 L of fluid daily. No palpitations, dizziness, presyncope, or syncope. He is no longer on Jardiance as his insurance company will not cover this and he cannot afford it. He does have an appointment with nephrology later this month.    Labs independently reviewed: 11/2020 - potassium 4.2, BUN 37, serum creatinine 2.34 10/2020 - Hgb 13.4, PLT 238, TSH 10.115, free T3 normal, free T4 normal, magnesium 2.1 TC 148, TG 152, HDL 23, LDL 95, A1c 10.3  Past Medical History:  Diagnosis Date  . (HFpEF) heart failure with preserved ejection fraction ([place])   . CAD (coronary artery disease)   . Chronic kidney disease (CKD), stage III (moderate) (HCC)   . Diabetes mellitus without complication ([place])   . Essential hypertension   . Hyperlipidemia   . Morbid obesity ([place])   . OSA (obstructive sleep apnea)   . Permanent atrial fibrillation ([place])   . Prostate CA [facility])     Past Surgical History:  Procedure Laterality Date  . WRIST SURGERY Right     Current Medications: Current Meds  Medication Sig  . apixaban (ELIQUIS) 5 MG TABS tablet Take 1 tablet (5 mg total) by mouth 2 (two) times daily.  Marland Kitchen aspirin 81 MG EC tablet Take 81 mg by mouth daily.  Marland Kitchen atorvastatin (LIPITOR) 80 MG tablet Take 1 tablet (80 mg total) by mouth at bedtime.  . Cholecalciferol (VITAMIN D3) 10 MCG (400 UNIT) CAPS Take 400 Units by mouth daily.  . furosemide (LASIX) 20 MG tablet Take 1 tablet (20 mg total) by mouth every other day. Take 20 mg every day and 1 tablet if you have a 5 lb weight gain in 1 day.  . isosorbide mononitrate (IMDUR) 30 MG 24 hr tablet Take 1 tablet (30 mg total) by mouth daily.  Marland Kitchen levothyroxine (SYNTHROID) 50 MCG tablet Take 1 tablet (50 mcg total) by mouth daily at  6 (six) AM.  . losartan (COZAAR) 25 MG tablet Take 0.5 tablets (12.5 mg total) by mouth daily.  . metFORMIN (GLUCOPHAGE) 1000 MG tablet Take 1,000 mg by mouth 2 (two) times daily.  . nitroGLYCERIN (NITROSTAT) 0.4 MG SL tablet Place 1 tablet (0.4 mg total) under the tongue See admin instructions. Place 1 tablet under the tongue every 5 minutes as needed for chest pain. Max of 3 doses in 15 minutes.  . potassium chloride SA (KLOR-CON) 20 MEQ tablet Take 1 tablet (20 mEq total) by mouth every other day.  . Semaglutide, 1 MG/DOSE, 2 MG/1.5ML SOPN Inject into the skin.  . tamsulosin (FLOMAX) 0.4 MG CAPS capsule Take 1 capsule (0.4 mg total) by mouth daily.  . [DISCONTINUED] metoprolol succinate (TOPROL-XL) 100 MG 24 hr tablet Take 1 tablet (100 mg total) by mouth daily. Take with or immediately following a meal.  . [DISCONTINUED] metoprolol succinate (TOPROL-XL) 50 MG 24 hr tablet Take 1 tablet (50 mg total) by  mouth every evening. Take with or immediately following a meal.    Allergies:   Prasugrel   Social History   Socioeconomic History  . Marital status: Soil scientist    Spouse name: Not on file  . Number of children: Not on file  . Years of education: Not on file  . Highest education level: Not on file  Occupational History  . Not on file  Tobacco Use  . Smoking status: Never Smoker  . Smokeless tobacco: Current User    Types: Snuff  Vaping Use  . Vaping Use: Never used  Substance and Sexual Activity  . Alcohol use: Yes    Comment: social-once a week  . Drug use: Never  . Sexual activity: Not Currently  Other Topics Concern  . Not on file  Social History Narrative  . Not on file   Social Determinants of Health   Financial Resource Strain: Not on file  Food Insecurity: Not on file  Transportation Needs: Not on file  Physical Activity: Not on file  Stress: Not on file  Social Connections: Not on file     Family History:  The patient's family history is not on  file.  ROS:   Review of Systems  Constitutional: Positive for malaise/fatigue. Negative for chills, diaphoresis, fever and weight loss.  HENT: Negative for congestion.   Eyes: Negative for discharge and redness.  Respiratory: Positive for shortness of breath. Negative for cough, hemoptysis, sputum production and wheezing.        Occasional need to take a deep inhalation   Cardiovascular: Negative for chest pain, palpitations, orthopnea, claudication, leg swelling and PND.  Gastrointestinal: Negative for abdominal pain, blood in stool, heartburn, melena, nausea and vomiting.  Genitourinary: Negative for hematuria.  Musculoskeletal: Negative for falls and myalgias.  Skin: Negative for rash.  Neurological: Positive for weakness. Negative for dizziness, tingling, tremors, sensory change, speech change, focal weakness and loss of consciousness.  Endo/Heme/Allergies: Does not bruise/bleed easily.  Psychiatric/Behavioral: Negative for substance abuse. The patient is not nervous/anxious.   All other systems reviewed and are negative.    EKGs/Labs/Other Studies Reviewed:    Studies reviewed were summarized above. The additional studies were reviewed today:  2D echo 12/21/2014 [facility]): Summary:  1. Left ventricle septal thickness is mildly increased. --with mild  concentric LVH  2. The left ventricular chamber size is mildly dilated.  3. Severe global hypokinesis of the left ventricle is observed.  4. The EF is estimated at 20-25%.  5. The left atrium is mildly dilated.  6. There is mild mitral regurgitation observed.  7. There is a small pleural effusion. --right sided  8. The inferior vena cava appears dilated suggesting elevated central  venous pressure. --mild increased  9. Since prior outside echo, there is significant decline in LVEF  10. Incidential gallstone noted  __________  LHC 12/22/2014([facility]): PRESSURES:  RA Pressure: 31 mean mmHG  RV Pressure: 62 mmHG   RVEDP:   32 mmHG  PA Pressure: 64/48 mmHG   Mean PA pressure: 53 mmHG  PCWP: Mean 44 mmHG no V wave  AO pressure: 125/101 mmHG   Mean AO pressure:  469 mmHG  LV systolic pressure: 22 mmHG   LVEDP: 40 mmHG   OXYGEN SATURATIONS:  RA: 49 %  PA:  49 %  Femoral:  94 %  Other: N/A   CARDIAC OUTPUT:  Thermodilution cardiac output:  4.4 L/min  Thermodilution cardiac index:  1.9 L/min/m2  Fickcardiac output:  3.2 L/min  Fick cardiac index:  1.3 L/min/m2    CORONARY ANATOMY:  Left Main: This vessel bifurcated into LAD, small ramus branch, and  moderately sized left circumflex vessel. The left main artery had no  stenosis   Left anterior descending artery: This vessel gave rise to a long 2 mm  small-to medium-sized diagonal branch. The diagonal branch had a 99% mid  stenosis that was not the culprit of the patient's profound  cardiomyopathy. The proximal LAD was patent. The mid LAD had a  moderately long 30-50% stenosis.   Left circumflex artery: This vessel and its large mid obtuse marginal  branch and medium-sized distal obtuse marginal branch was free of disease.   Right coronary artery: This vessel gave rise to a long proximal vessel  stent with mild in-stent restenosis. The remainder of the RCA and its  medium-sized PDA and small-to medium-sized posterior lateral branch were  free of disease.    LEFT VENTRICULOGRAM: This was performed by hand injection to spare dye.   There is underfilling of the ventricle showing severe LV chamber  enlargement with severe global hypokinesis and left ventricular ejection  fraction less than 20%. There was inability to use assess for mitral  regurgitation or aortic size.   CONCLUSIONS:  1. Severe small vessel disease of a single branch otherwise mild LAD  disease and very mild in-stent restenosis of the right coronary artery;  the cardiomyopathy is out of proportion to severity of coronary disease  2.  Severe LV systolic dysfunction  3. Profound volume overload by right heart catheterization with marginal  cardiac index    PLAN:   1. IV Lasix diuresis  2. Continue to advance carvedilol and ACE inhibitor therapy ___________  2D echo 10/23/2017([facility]): Summary:  1. Left ventricle septal thickness is mildly increased. --with  concentric LVH  2. The EF is estimated at 55-60%.  3. Abnormal diastolic filling pattern consistent with underlying atrial  fibrillation was observed. --with heart rate 66 to 94 bpm  4. The right ventricular cavity size is mildly enlarged.  5. The right atrium is mild to moderately dilated.   6. The inferior vena cava is not visualized.  7. There is mild dilatation of the ascending aorta. --at 3.6 cm   Recommendation:  1. Consider non compilance of OSA therapy contributing as an etiology  for recent fluid retention symptoms __________  2D echo 11/16/2020: 1. Left ventricular ejection fraction, by estimation, is 35 to 40%. The  left ventricle has moderately decreased function. The left ventricle  demonstrates global hypokinesis. There is mild left ventricular  hypertrophy. Left ventricular diastolic  parameters are indeterminate.  2. Right ventricular systolic function is mildly reduced. The right  ventricular size is normal. Tricuspid regurgitation signal is inadequate  for assessing PA pressure.  3. Left atrial size was moderately dilated.  4. Right atrial size was mild to moderately dilated.  5. The mitral valve is normal in structure. Trivial mitral valve  regurgitation. No evidence of mitral stenosis.  6. The aortic valve is tricuspid. There is mild thickening of the aortic  valve. Aortic valve regurgitation is trivial. Mild aortic valve sclerosis  is present, with no evidence of aortic valve stenosis. __________  Carlton Adam MPI 11/17/2020:  There was no ST segment deviation noted during stress.  No T wave inversion was noted  during stress.  This is an intermediate risk study due to reduced ejection fraction.  The left ventricular ejection fraction is moderately decreased visually (35%). measured EF was 20%.  correlation with echo advised  There was no evidence for ischemia   EKG:  EKG is ordered today.  The EKG ordered today demonstrates Afib, 95 bpm, occasional PVCs, nonspecific IVCD, nonspecific st/t changes   Recent Labs: 11/16/2020: B Natriuretic Peptide 244.1; TSH 10.115 11/17/2020: Magnesium 2.1 11/18/2020: Hemoglobin 13.4; Platelets 238 11/25/2020: Potassium 4.2; Sodium 137 12/10/2020: BUN 37; Creatinine, Ser 2.34  Recent Lipid Panel    Component Value Date/Time   CHOL 148 11/16/2020 0641   TRIG 152 (H) 11/16/2020 0641   HDL 23 (L) 11/16/2020 0641   CHOLHDL 6.4 11/16/2020 0641   VLDL 30 11/16/2020 0641   LDLCALC 95 11/16/2020 0641    PHYSICAL EXAM:    VS:  BP 104/60 (BP Location: Left Arm, Patient Position: Sitting, Cuff Size: Normal)   Pulse 95   Ht 5\' 11"  (1.803 m)   Wt 270 lb (122.5 kg)   SpO2 96%   BMI 37.66 kg/m   BMI: Body mass index is 37.66 kg/m.  Physical Exam Vitals reviewed.  Constitutional:      Appearance: He is well-developed and well-nourished.  HENT:     Head: Normocephalic and atraumatic.  Eyes:     General:        Right eye: No discharge.        Left eye: No discharge.  Neck:     Vascular: No JVD.  Cardiovascular:     Rate and Rhythm: Normal rate. Rhythm irregularly irregular.     Pulses: No midsystolic click and no opening snap.          Posterior tibial pulses are 2+ on the right side and 2+ on the left side.     Heart sounds: Normal heart sounds, S1 normal and S2 normal. Heart sounds not distant. No murmur heard. No friction rub.  Pulmonary:     Effort: Pulmonary effort is normal. No respiratory distress.     Breath sounds: Normal breath sounds. No decreased breath sounds, wheezing or rales.  Chest:     Chest wall: No tenderness.  Abdominal:      General: There is no distension.     Palpations: Abdomen is soft.     Tenderness: There is no abdominal tenderness.  Musculoskeletal:     Cervical back: Normal range of motion.     Right lower leg: Edema present.     Left lower leg: Edema present.     Comments: Trace bilateral pretibial edema.   Skin:    General: Skin is warm and dry.     Nails: There is no clubbing or cyanosis.  Neurological:     Mental Status: He is alert and oriented to person, place, and time.  Psychiatric:        Mood and Affect: Mood and affect normal.        Speech: Speech normal.        Behavior: Behavior normal.        Thought Content: Thought content normal.        Judgment: Judgment normal.     Wt Readings from Last 3 Encounters:  12/24/20 270 lb (122.5 kg)  12/10/20 275 lb (124.7 kg)  11/26/20 270 lb (122.5 kg)     ASSESSMENT & PLAN:   1. CAD involving the native coronary arteries without angina: He is doing well and is without symptoms concerning for angina. Recent Lexiscan MPI showed no evidence of scar or significant ischemia.  Continue secondary prevention and current medical therapy including aspirin, atorvastatin, Toprol-XL, and  losartan along with as needed SL NTG.  We will continue to optimize medical therapy over the next couple of months followed by a repeat limited echo as outlined below to evaluate for improvement in his LV systolic function. No indication for further ischemic testing at this time while we await repeat echo.   2. HFrEF: He appears euvolemic and well compensated with NYHA class II symptoms. Weight is stable. He is no longer on Jardiance as his insurance company will not cover this medication and he cannot afford it.  He will check with his insurance company to see if they will cover [place]. If so, we will reach out to his PCP to see if they are ok with [place] placing him on this medication given his underlying DM and cardiomyopathy. To further optimize his GDMT, and in the setting  of PVCs noted on 12-lead today, we will titrate his Toprol XL to 100 mg bid. In an effort to minimize BP drop, we will stop his Imdur. He will continue losartan 12.5 mg. We are not able to transition him to [facility] in place of losartan given relative hypotension. He is not on an MRA with underlying CKD. Continue Lasix 20 mg eery other day.  We will plan to update a limited echo in several months time to reassess LVSF following optimization of GDMT on maximally tolerated evidence medical therapy.  If at that time his cardiomyopathy persists we may need to pursue diagnostic R/LHC if renal function allows.  Otherwise, if his cardiomyopathy has improved it is suspected that his drop in EF was nonischemic and potentially tachycardia mediated in the setting of persistent A. fib with RVR.  CHF education discussed in detail.  3. Permanent A. Fib: Ventricular rates are reasonably controlled, though high normal. Asymptomatic. Titrate Toprol XL to 100 mg bid for added rate control. Given his CHADS2VASc of 5, he remains on Eliquis without symptoms concerning for bleeding. Check BMP and CBC. Given his last EKG showing sinus rhythm is dated 2012 it appears rhythm control strategy may not be an option and we will pursue rate control.  4. PVCs: Possibly contributing to his need to take intermittent deep inhalation. Place a 3-day Zio patch to quantify PVC burden. Check BMP and magnesium. Titrate Toprol XL to 100 mg bid. Recent Lexiscan MPI without significant ischemia as above.   5. Pulmonary hypertension: Likely in the setting of underlying OSA with obesity and possible OHS.  CPAP recommended.   6. CKD stage III: Most recent renal function stable.  Check BMP to allow for potential escalation of GDMT as tolerated.  He has an appointment with nephrology later this month to establish care.  He remains on losartan as above.   7. HTN: Blood pressure is on the low side in the office, though at home this has been in the 347Q  mmHg systolic.  Continue current medical therapy as above.   8. HLD: LDL 95 from 10/2020 after being off medications for approximately 4 weeks.  Currently he remains on atorvastatin 80 mg.  Plan to recheck fasting lipid panel and LFT in 4 weeks.   9. OSA: Compliance with CPAP recommended.   Disposition: F/u with Dr. Saunders Revel or an APP in 1 month.   Medication Adjustments/Labs and Tests Ordered: Current medicines are reviewed at length with the patient today.  Concerns regarding medicines are outlined above. Medication changes, Labs and Tests ordered today are summarized above and listed in the Patient Instructions accessible in Encounters.   Signed,  Christell Faith, PA-C 12/24/2020 8:55 AM     [place] [address] [place] [address] [place], [place] [address] [phone]

## 2020-12-20 ENCOUNTER — Telehealth: Payer: Self-pay | Admitting: *Deleted

## 2020-12-20 NOTE — Telephone Encounter (Signed)
-----   Message from Abbie Sons, MD sent at 12/20/2020  9:21 AM EST ----- PSA was 0.8.  Recommend lab visit for follow-up PSA April 2022

## 2020-12-20 NOTE — Telephone Encounter (Signed)
Left message on vm per DPR

## 2020-12-24 ENCOUNTER — Ambulatory Visit (INDEPENDENT_AMBULATORY_CARE_PROVIDER_SITE_OTHER): Payer: Commercial Managed Care - PPO

## 2020-12-24 ENCOUNTER — Encounter: Payer: Self-pay | Admitting: Physician Assistant

## 2020-12-24 ENCOUNTER — Other Ambulatory Visit: Payer: Self-pay

## 2020-12-24 ENCOUNTER — Ambulatory Visit (INDEPENDENT_AMBULATORY_CARE_PROVIDER_SITE_OTHER): Payer: Commercial Managed Care - PPO | Admitting: Physician Assistant

## 2020-12-24 VITALS — BP 104/60 | HR 95 | Ht 71.0 in | Wt 270.0 lb

## 2020-12-24 DIAGNOSIS — G4733 Obstructive sleep apnea (adult) (pediatric): Secondary | ICD-10-CM

## 2020-12-24 DIAGNOSIS — I272 Pulmonary hypertension, unspecified: Secondary | ICD-10-CM

## 2020-12-24 DIAGNOSIS — I493 Ventricular premature depolarization: Secondary | ICD-10-CM | POA: Diagnosis not present

## 2020-12-24 DIAGNOSIS — I5020 Unspecified systolic (congestive) heart failure: Secondary | ICD-10-CM | POA: Diagnosis not present

## 2020-12-24 DIAGNOSIS — I2510 Atherosclerotic heart disease of native coronary artery without angina pectoris: Secondary | ICD-10-CM

## 2020-12-24 DIAGNOSIS — I482 Chronic atrial fibrillation, unspecified: Secondary | ICD-10-CM | POA: Diagnosis not present

## 2020-12-24 DIAGNOSIS — E785 Hyperlipidemia, unspecified: Secondary | ICD-10-CM

## 2020-12-24 DIAGNOSIS — N183 Chronic kidney disease, stage 3 unspecified: Secondary | ICD-10-CM

## 2020-12-24 DIAGNOSIS — I10 Essential (primary) hypertension: Secondary | ICD-10-CM

## 2020-12-24 MED ORDER — METOPROLOL SUCCINATE ER 100 MG PO TB24: 100.0000 mg | ORAL_TABLET | Freq: Two times a day (BID) | ORAL | 3 refills | Status: DC

## 2020-12-24 NOTE — Patient Instructions (Addendum)
Medication Instructions:  Your physician has recommended you make the following change in your medication:  1. STOP Isosorbide mononitrate (Imdur) 2. INCREASE Metoprolol Succinate (Toprol XL) to 100 mg twice a day.   *If you need a refill on your cardiac medications before your next appointment, please call your pharmacy*   Lab Work: BMET, CBC, Mag done today  If you have labs (blood work) drawn today and your tests are completely normal, you will receive your results only by: Marland Kitchen MyChart Message (if you have MyChart) OR . A paper copy in the mail If you have any lab test that is abnormal or we need to change your treatment, we will call you to review the results.   Testing/Procedures: Your physician has recommended that you wear a Zio monitor for 3 days. This monitor is a medical device that records the heart's electrical activity. Doctors most often use these monitors to diagnose arrhythmias. Arrhythmias are problems with the speed or rhythm of the heartbeat. The monitor is a small device applied to your chest. You can wear one while you do your normal daily activities. While wearing this monitor if you have any symptoms to push the button and record what you felt. Once you have worn this monitor for the period of time provider prescribed (Usually 14 days), you will return the monitor device in the postage paid box. Once it is returned they will download the data collected and provide [place] with a report which the provider will then review and we will call you with those results. Important tips:  1. Avoid showering during the first 24 hours of wearing the monitor. 2. Avoid excessive sweating to help maximize wear time. 3. Do not submerge the device, no hot tubs, and no swimming pools. 4. Keep any lotions or oils away from the patch. 5. After 24 hours you may shower with the patch on. Take brief showers with your back facing the shower head.  6. Do not remove patch once it has been placed  because that will interrupt data and decrease adhesive wear time. 7. Push the button when you have any symptoms and write down what you were feeling. 8. Once you have completed wearing your monitor, remove and place into box which has postage paid and place in your outgoing mailbox.  9. If for some reason you have misplaced your box then call our office and we can provide another box and/or mail it off for you.       Follow-Up: At [facility], you and your health needs are our priority.  As part of our continuing mission to provide you with exceptional heart care, we have created designated Provider Care Teams.  These Care Teams include your primary Cardiologist (physician) and Advanced Practice Providers (APPs -  Physician Assistants and Nurse Practitioners) who all work together to provide you with the care you need, when you need it.  We recommend signing up for the patient portal called "MyChart".  Sign up information is provided on this After Visit Summary.  MyChart is used to connect with patients for Virtual Visits (Telemedicine).  Patients are able to view lab/test results, encounter notes, upcoming appointments, etc.  Non-urgent messages can be sent to your provider as well.   To learn more about what you can do with MyChart, go to [email or web address].    Your next appointment:   1 month(s)  The format for your next appointment:   In Person  Provider:   Nelva Bush, MD or  Christell Faith, PA-C   Other Instructions Please call insurance to see if they will cover [place].

## 2020-12-25 LAB — BASIC METABOLIC PANEL
BUN/Creatinine Ratio: 15 (ref 10–24)
BUN: 33 mg/dL — ABNORMAL HIGH (ref 8–27)
CO2: 21 mmol/L (ref 20–29)
Calcium: 9.2 mg/dL (ref 8.6–10.2)
Chloride: 101 mmol/L (ref 96–106)
Creatinine, Ser: 2.17 mg/dL — ABNORMAL HIGH (ref 0.76–1.27)
GFR calc Af Amer: 35 mL/min/{1.73_m2} — ABNORMAL LOW (ref >59)
GFR calc non Af Amer: 31 mL/min/{1.73_m2} — ABNORMAL LOW (ref >59)
Glucose: 149 mg/dL — ABNORMAL HIGH (ref 65–99)
Potassium: 4.6 mmol/L (ref 3.5–5.2)
Sodium: 136 mmol/L (ref 134–144)

## 2020-12-25 LAB — CBC
Hematocrit: 45 % (ref 37.5–51.0)
Hemoglobin: 14.6 g/dL (ref 13.0–17.7)
MCH: 26.6 pg (ref 26.6–33.0)
MCHC: 32.4 g/dL (ref 31.5–35.7)
MCV: 82 fL (ref 79–97)
Platelets: 226 10*3/uL (ref 150–450)
RBC: 5.49 x10E6/uL (ref 4.14–5.80)
RDW: 17 % — ABNORMAL HIGH (ref 11.6–15.4)
WBC: 8.4 10*3/uL (ref 3.4–10.8)

## 2020-12-25 LAB — MAGNESIUM: Magnesium: 2 mg/dL (ref 1.6–2.3)

## 2020-12-27 ENCOUNTER — Telehealth: Payer: Self-pay | Admitting: *Deleted

## 2020-12-27 NOTE — Telephone Encounter (Signed)
-----   Message from Rise Mu, [place] sent at 12/27/2020  7:37 AM EST ----- Renal function remains abnormal, though is improved and consistent with more recent readings since reestablishing care. Potassium is at goal.  Random glucose is mildly elevated with known diabetes.  Magnesium is at goal.  Blood count is normal.

## 2020-12-27 NOTE — Telephone Encounter (Signed)
Left voicemail message to call back to discuss results.

## 2020-12-27 NOTE — Telephone Encounter (Signed)
Left voicemail message to call back for results.  

## 2020-12-28 NOTE — Telephone Encounter (Signed)
Spoke to pt, notified of lab results.  Pt verbalized understanding an has no further questions.  Pt does have appointment with nephrology next week in regards to abnormal renal function.

## 2020-12-28 NOTE — Telephone Encounter (Signed)
Patient calling to discuss recent testing results  ° °Please call  ° °

## 2021-01-07 ENCOUNTER — Telehealth: Payer: Self-pay | Admitting: *Deleted

## 2021-01-07 ENCOUNTER — Other Ambulatory Visit: Payer: Self-pay | Admitting: Nephrology

## 2021-01-07 ENCOUNTER — Other Ambulatory Visit (HOSPITAL_COMMUNITY): Payer: Self-pay | Admitting: Nephrology

## 2021-01-07 DIAGNOSIS — I493 Ventricular premature depolarization: Secondary | ICD-10-CM

## 2021-01-07 DIAGNOSIS — R8290 Unspecified abnormal findings in urine: Secondary | ICD-10-CM

## 2021-01-07 DIAGNOSIS — E1122 Type 2 diabetes mellitus with diabetic chronic kidney disease: Secondary | ICD-10-CM

## 2021-01-07 DIAGNOSIS — R809 Proteinuria, unspecified: Secondary | ICD-10-CM

## 2021-01-07 DIAGNOSIS — N1832 Chronic kidney disease, stage 3b: Secondary | ICD-10-CM

## 2021-01-07 DIAGNOSIS — I482 Chronic atrial fibrillation, unspecified: Secondary | ICD-10-CM

## 2021-01-07 DIAGNOSIS — I10 Essential (primary) hypertension: Secondary | ICD-10-CM

## 2021-01-07 NOTE — Telephone Encounter (Signed)
Attempted to call pt to review results and provider's recc.  No answer. Lmtcb.  

## 2021-01-07 NOTE — Telephone Encounter (Signed)
-----   Message from Rise Mu, [place] sent at 01/07/2021  8:05 AM EST ----- Cardiac monitor showed a 100% Afib burden with an average ventricular rate of 83 bpm (range 48-146 bpm). Frequent extra beats from the bottom of the heart vs aberrancy representing an 8.5% burden. No prolonged pauses noted. Currently on maximum dose Toprol XL with potassium and magnesium at goal and normal thyroid function. I would like for him to see EP for further discussion on management of his Afib and frequent PVCs in the context of his cardiomyopathy.

## 2021-01-11 NOTE — Telephone Encounter (Signed)
Patient made aware of cardiac monitor results with Christell Faith, PA recommendation. Patient is agreeable with the referral to EP. Advised the patient that scheduling will contact him to schedule. Patients sts that due to his work schedule the appt with need to be scheduled Tues-Fri morning.

## 2021-01-18 ENCOUNTER — Other Ambulatory Visit: Payer: Self-pay

## 2021-01-18 ENCOUNTER — Ambulatory Visit
Admission: RE | Admit: 2021-01-18 | Discharge: 2021-01-18 | Disposition: A | Discharge status: Home / Self Care | Payer: Commercial Managed Care - PPO | Source: Ambulatory Visit | Attending: Nephrology | Admitting: Nephrology

## 2021-01-18 DIAGNOSIS — I10 Essential (primary) hypertension: Secondary | ICD-10-CM | POA: Diagnosis present

## 2021-01-18 DIAGNOSIS — E1122 Type 2 diabetes mellitus with diabetic chronic kidney disease: Secondary | ICD-10-CM | POA: Diagnosis present

## 2021-01-18 DIAGNOSIS — R8290 Unspecified abnormal findings in urine: Secondary | ICD-10-CM | POA: Insufficient documentation

## 2021-01-18 DIAGNOSIS — N1832 Chronic kidney disease, stage 3b: Secondary | ICD-10-CM | POA: Diagnosis present

## 2021-01-18 DIAGNOSIS — R809 Proteinuria, unspecified: Secondary | ICD-10-CM | POA: Diagnosis present

## 2021-01-27 ENCOUNTER — Telehealth: Payer: Self-pay | Admitting: Internal Medicine

## 2021-01-27 ENCOUNTER — Ambulatory Visit (INDEPENDENT_AMBULATORY_CARE_PROVIDER_SITE_OTHER): Payer: Commercial Managed Care - PPO | Admitting: Internal Medicine

## 2021-01-27 ENCOUNTER — Encounter: Payer: Self-pay | Admitting: Internal Medicine

## 2021-01-27 ENCOUNTER — Other Ambulatory Visit: Payer: Self-pay

## 2021-01-27 VITALS — BP 128/80 | HR 76 | Ht 71.0 in | Wt 275.4 lb

## 2021-01-27 DIAGNOSIS — I10 Essential (primary) hypertension: Secondary | ICD-10-CM

## 2021-01-27 DIAGNOSIS — I493 Ventricular premature depolarization: Secondary | ICD-10-CM | POA: Diagnosis not present

## 2021-01-27 DIAGNOSIS — I4821 Permanent atrial fibrillation: Secondary | ICD-10-CM

## 2021-01-27 DIAGNOSIS — I5020 Unspecified systolic (congestive) heart failure: Secondary | ICD-10-CM | POA: Diagnosis not present

## 2021-01-27 MED ORDER — MEXILETINE HCL 200 MG PO CAPS: 200.0000 mg | ORAL_CAPSULE | Freq: Two times a day (BID) | ORAL | 6 refills | Status: DC

## 2021-01-27 MED ORDER — FUROSEMIDE 20 MG PO TABS: ORAL_TABLET | ORAL | Status: DC

## 2021-01-27 MED ORDER — DIGOXIN 125 MCG PO TABS: 0.1250 mg | ORAL_TABLET | Freq: Every day | ORAL | 6 refills | Status: DC

## 2021-01-27 NOTE — Patient Instructions (Addendum)
Medication Instructions:  - Your physician has recommended you make the following change in your medication:   1) STOP Aspirin  2) CHANGE lasix (furosemide) 20 mg- take 2 tablets (40 mg) by mouth once EVERY OTHER day  3) START mexiletine 200 mg- take 1 capsule (200 mg) by mouth TWICE daily  4) START digoxin 0.125 mg- take 1 tablet (0.125 mg) by mouth ONCE daily  *If you need a refill on your cardiac medications before your next appointment, please call your pharmacy*   Lab Work: - none ordered  If you have labs (blood work) drawn today and your tests are completely normal, you will receive your results only by: Marland Kitchen MyChart Message (if you have MyChart) OR . A paper copy in the mail If you have any lab test that is abnormal or we need to change your treatment, we will call you to review the results.   Testing/Procedures: - Dr. Caryl Comes would like a 3 day heart monitor to be placed on you at your next office visit with Christell Faith, PA.   Follow-Up: At [facility], you and your health needs are our priority.  As part of our continuing mission to provide you with exceptional heart care, we have created designated Provider Care Teams.  These Care Teams include your primary Cardiologist (physician) and Advanced Practice Providers (APPs -  Physician Assistants and Nurse Practitioners) who all work together to provide you with the care you need, when you need it.  We recommend signing up for the patient portal called "MyChart".  Sign up information is provided on this After Visit Summary.  MyChart is used to connect with patients for Virtual Visits (Telemedicine).  Patients are able to view lab/test results, encounter notes, upcoming appointments, etc.  Non-urgent messages can be sent to your provider as well.   To learn more about what you can do with MyChart, go to [email or web address].    Your next appointment:   1) as scheduled with Christell Faith, PA   2) with Dr. Caryl Comes- pending the  results of the 3 day heart monitor  The format for your next appointment:   In Person  Provider:   as above   Other Instructions  Mexiletine capsules What is this medicine? MEXILETINE (mex [place] e teen) is an antiarrhythmic agent. This medicine is used to treat irregular heart rhythm and can slow rapid heartbeats. It can help your heart to return to and maintain a normal rhythm. Because of the side effects caused by this medicine, it is usually used for heartbeat problems that may be life-threatening. This medicine may be used for other purposes; ask your health care provider or pharmacist if you have questions. COMMON BRAND NAME(S): Mexitil What should I tell my health care provider before I take this medicine? They need to know if you have any of these conditions:  liver disease  other heart problems  previous heart attack  an unusual or allergic reaction to mexiletine, other medicines, foods, dyes, or preservatives  pregnant or trying to get pregnant  breast-feeding How should I use this medicine? Take this medicine by mouth with a glass of water. Follow the directions on the prescription label. It is recommended that you take this medicine with food or an antacid. Take your doses at regular intervals. Do not take your medicine more often than directed. Do not stop taking except on the advice of your doctor or health care professional. Talk to your pediatrician regarding the use of this medicine in  children. Special care may be needed. Overdosage: If you think you have taken too much of this medicine contact a poison control center or emergency room at once. NOTE: This medicine is only for you. Do not share this medicine with others. What if I miss a dose? If you miss a dose, take it as soon as you can. If it is almost time for your next dose, take only that dose. Do not take double or extra doses. What may interact with this medicine? Do not take this medicine with any of the  following medications:  dofetilide This medicine may also interact with the following medications:  caffeine  cimetidine  medicines for depression, anxiety, or psychotic disturbances  medicines to control heart rhythm  phenobarbital  phenytoin  rifampin  theophylline This list may not describe all possible interactions. Give your health care provider a list of all the medicines, herbs, non-prescription drugs, or dietary supplements you use. Also tell them if you smoke, drink alcohol, or use illegal drugs. Some items may interact with your medicine. What should I watch for while using this medicine? Your condition will be monitored closely when you first begin therapy. Often, this drug is first started in a hospital or other monitored health care setting. Once you are on maintenance therapy, visit your doctor or health care provider for regular checks on your progress. Because your condition and use of this medicine carry some risk, it is a good idea to carry an identification card, necklace or bracelet with details of your condition, medications, and doctor or health care provider. You may get drowsy or dizzy. Do not drive, use machinery, or do anything that needs mental alertness until you know how this medicine affects you. Do not stand or sit up quickly, especially if you are an older patient. This reduces the risk of dizzy or fainting spells. Alcohol can make you more dizzy, increase flushing and rapid heartbeats. Avoid alcoholic drinks. This medicine may cause serious skin reactions. They can happen weeks to months after starting the medicine. Contact your health care provider right away if you notice fevers or flu-like symptoms with a rash. The rash may be red or purple and then turn into blisters or peeling of the skin. Or, you might notice a red rash with swelling of the face, lips or lymph nodes in your neck or under your arms. What side effects may I notice from receiving this  medicine? Side effects that you should report to your doctor or health care professional as soon as possible:  allergic reactions like skin rash, itching or hives, swelling of the face, lips, or tongue  breathing problems  chest pain, continued irregular heartbeats  rash, fever, and swollen lymph nodes  redness, blistering, peeling or loosening of the skin, including inside the mouth  seizures  skin rash  trembling, shaking  unusual bleeding or bruising  unusually weak or tired Side effects that usually do not require medical attention (report to your doctor or health care professional if they continue or are bothersome):  blurred vision  difficulty walking  heartburn  nausea, vomiting  nervousness  numbness, or tingling in the fingers or toes This list may not describe all possible side effects. Call your doctor for medical advice about side effects. You may report side effects to FDA at 1-800-FDA-1088. Where should I keep my medicine? Keep out of reach of children. Store at room temperature between 15 and 30 degrees C (59 and 86 degrees F). Throw away  any unused medicine after the expiration date. NOTE: This sheet is a summary. It may not cover all possible information. If you have questions about this medicine, talk to your doctor, pharmacist, or health care provider.  2021 Elsevier/Gold Standard (2019-02-12 09:25:42)   Digoxin tablets or capsules What is this medicine? DIGOXIN (di JOX in) is used to treat congestive heart failure and heart rhythm problems. This medicine may be used for other purposes; ask your health care provider or pharmacist if you have questions. COMMON BRAND NAME(S): Digitek, Lanoxicaps, Lanoxin What should I tell my health care provider before I take this medicine? They need to know if you have any of these conditions:  certain heart rhythm disorders  heart disease or recent heart attack  kidney or liver disease  an unusual or  allergic reaction to digoxin, other medicines, foods, dyes, or preservatives  pregnant or trying to get pregnant  breast-feeding How should I use this medicine? Take this medicine by mouth with a glass of water. Follow the directions on the prescription label. Take your doses at regular intervals. Do not take your medicine more often than directed. Talk to your pediatrician regarding the use of this medicine in children. Special care may be needed. Overdosage: If you think you have taken too much of this medicine contact a poison control center or emergency room at once. NOTE: This medicine is only for you. Do not share this medicine with others. What if I miss a dose? If you miss a dose, take it as soon as you can. If it is almost time for your next dose, take only that dose. Do not take double or extra doses. What may interact with this medicine?  activated charcoal  albuterol  alprazolam  antacids  antiviral medicines for HIV or AIDS like ritonavir and saquinavir  calcium  certain antibiotics like azithromycin, clarithromycin, erythromycin, gentamicin, neomycin, trimethoprim, and tetracycline  certain medicines for blood pressure, heart disease, irregular heart beat  certain medicines for cancer  certain medicines for cholesterol like atorvastatin, cholestyramine, and colestipol  certain medicines for diabetes, like acarbose, exenatide, miglitol, and metformin  certain medicines for fungal infections like ketoconazole and itraconazole  certain medicines for stomach problems like omeprazole, esomeprazole, lansoprazole, rabeprazole, metoclopramide, and sucralfate  conivaptan  cyclosporine  diphenoxylate  epinephrine  kaolin; pectin  nefazodone  NSAIDS, medicines for pain and inflammation, like celecoxib, ibuprofen, or naproxen  penicillamine  phenytoin  propantheline  quinine  phenytoin  rifampin  succinylcholine  St. John's  Wort  sulfasalazine  teriparatide  thyroid hormones  tolvaptan This list may not describe all possible interactions. Give your health care provider a list of all the medicines, herbs, non-prescription drugs, or dietary supplements you use. Also tell them if you smoke, drink alcohol, or use illegal drugs. Some items may interact with your medicine. What should I watch for while using this medicine? Visit your doctor or health care professional for regular checks on your progress. Do not stop taking this medicine without the advice of your doctor or health care professional, even if you feel better. Do not change the brand you are taking, other brands may affect you differently. Check your heart rate and blood pressure regularly while you are taking this medicine. Ask your doctor or health care professional what your heart rate and blood pressure should be, and when you should contact him or her. Your doctor or health care professional also may schedule regular blood tests and electrocardiograms to check your progress. Watch  your diet. Less digoxin may be absorbed from the stomach if you have a diet high in bran fiber. Do not treat yourself for coughs, colds or allergies without asking your doctor or health care professional for advice. Some ingredients can increase possible side effects. What side effects may I notice from receiving this medicine? Side effects that you should report to your doctor or health care professional as soon as possible:  allergic reactions like skin rash, itching or hives, swelling of the face, lips, or tongue  changes in behavior, mood, or mental ability  changes in vision  confusion  fast, irregular heartbeat  feeling faint or lightheaded, falls  headache  nausea, vomiting  unusual bleeding, bruising  unusually weak or tired Side effects that usually do not require medical attention (report to your doctor or health care professional if they continue or  are bothersome):  breast enlargement in men and women  diarrhea This list may not describe all possible side effects. Call your doctor for medical advice about side effects. You may report side effects to FDA at 1-800-FDA-1088. Where should I keep my medicine? Keep out of the reach of children. Store at room temperature between 15 and 30 degrees C (59 and 86 degrees F). Protect from light and moisture. Throw away any unused medicine after the expiration date. NOTE: This sheet is a summary. It may not cover all possible information. If you have questions about this medicine, talk to your doctor, pharmacist, or health care provider.  2021 Elsevier/Gold Standard (2016-10-25 15:40:26)

## 2021-01-27 NOTE — Telephone Encounter (Signed)
Patient calling  States that he will be able to take the farxiga 10mg 

## 2021-01-27 NOTE — Progress Notes (Signed)
ELECTROPHYSIOLOGY CONSULT NOTE  Patient ID: Joshua Landry, MRN: [id number], DOB/AGE: 12-03-1955 65 y.o. Admit date: (Not on file) Date of Consult: 01/27/2021  Primary Physician: Rose Phi, [place] Primary Cardiologist: CE   Joshua Landry is a 65 y.o. male who is being seen today for the evaluation of PVCs in the setting of cardiomyopathy at the request of RD/CE.    HPI Joshua Landry is a 65 y.o. male with a complex past cardiac history dating back to 2011 with non-STEMI and undergoing stenting of her RCA.  Complicated repeatedly with ISR some requiring intervention other than medical therapy referred now for PVCs identified in the setting of permanent atrial fibrillation  Left ventricular dysfunction has been variable as noted below.  Atrial fibrillation has been permanent with last sinus rhythm documented in 2012  Hospitalization was noted to have PVCs.  2/22 event Recorder personnally reviewed   atrial fibrillation with average rate of 83 (48--146) 3 PVC burden of 8 0.5%  Moderate dyspnea on exertion.  Also 3 pillow orthopnea and peripheral edema with bendopnea.  Denies exertional chest pain.  No syncope or palpitations.  DATE TEST EF   2/16 Echo   20.25 %   2/16 LHC  RCA ISR  2018 Echo  55-60% LAE mild  12/21 Echo  35-40%   12/21 Myoview  20- 35 % No Ischemia        Date Cr K Hgb  2/22 2.17 4.6 14.6      Past Medical History:  Diagnosis Date  . (HFpEF) heart failure with preserved ejection fraction ([place])   . CAD (coronary artery disease)   . Chronic kidney disease (CKD), stage III (moderate) (HCC)   . Diabetes mellitus without complication ([place])   . Essential hypertension   . Hyperlipidemia   . Morbid obesity ([place])   . OSA (obstructive sleep apnea)   . Permanent atrial fibrillation ([place])   . Prostate CA [facility])       Surgical History:  Past Surgical History:  Procedure Laterality Date  . WRIST SURGERY Right      Home Meds: Current Meds   Medication Sig  . apixaban (ELIQUIS) 5 MG TABS tablet Take 1 tablet (5 mg total) by mouth 2 (two) times daily.  Marland Kitchen aspirin 81 MG EC tablet Take 81 mg by mouth daily.  Marland Kitchen atorvastatin (LIPITOR) 80 MG tablet Take 1 tablet (80 mg total) by mouth at bedtime.  . Cholecalciferol (VITAMIN D3) 10 MCG (400 UNIT) CAPS Take 400 Units by mouth daily.  . furosemide (LASIX) 20 MG tablet Take 1 tablet (20 mg total) by mouth every other day. Take 20 mg every day and 1 tablet if you have a 5 lb weight gain in 1 day.  Marland Kitchen glimepiride (AMARYL) 4 MG tablet Take 4 mg by mouth daily with breakfast.  . isosorbide mononitrate (IMDUR) 30 MG 24 hr tablet Take 1 tablet (30 mg total) by mouth daily.  Marland Kitchen levothyroxine (SYNTHROID) 50 MCG tablet Take 1 tablet (50 mcg total) by mouth daily at 6 (six) AM.  . losartan (COZAAR) 25 MG tablet Take 0.5 tablets (12.5 mg total) by mouth daily.  . metoprolol succinate (TOPROL-XL) 100 MG 24 hr tablet Take 1 tablet (100 mg total) by mouth 2 (two) times daily. Take with or immediately following a meal.  . nitroGLYCERIN (NITROSTAT) 0.4 MG SL tablet Place 1 tablet (0.4 mg total) under the tongue See admin instructions. Place 1 tablet under the tongue every  5 minutes as needed for chest pain. Max of 3 doses in 15 minutes.  . potassium chloride SA (KLOR-CON) 20 MEQ tablet Take 1 tablet (20 mEq total) by mouth every other day.  . Semaglutide, 1 MG/DOSE, 2 MG/1.5ML SOPN Inject into the skin.  . tamsulosin (FLOMAX) 0.4 MG CAPS capsule Take 1 capsule (0.4 mg total) by mouth daily.    Allergies:  Allergies  Allergen Reactions  . Prasugrel Rash    Rash  Rash  Rash      Social History   Socioeconomic History  . Marital status: Soil scientist    Spouse name: Not on file  . Number of children: Not on file  . Years of education: Not on file  . Highest education level: Not on file  Occupational History  . Not on file  Tobacco Use  . Smoking status: Never Smoker  . Smokeless tobacco:  Current User    Types: Snuff  Vaping Use  . Vaping Use: Never used  Substance and Sexual Activity  . Alcohol use: Yes    Comment: social-once a week  . Drug use: Never  . Sexual activity: Not Currently  Other Topics Concern  . Not on file  Social History Narrative  . Not on file   Social Determinants of Health   Financial Resource Strain: Not on file  Food Insecurity: Not on file  Transportation Needs: Not on file  Physical Activity: Not on file  Stress: Not on file  Social Connections: Not on file  Intimate Partner Violence: Not on file     History reviewed. No pertinent family history.   ROS:  Please see the history of present illness.     All other systems reviewed and negative.    Physical Exam: Blood pressure 128/80, pulse 76, height 5\' 11"  (1.803 m), weight 275 lb 6 oz (124.9 kg), SpO2 98 %. General: Well developed, Morbidly obese  male in no acute distress. Head: Normocephalic, atraumatic, sclera non-icteric, no xanthomas, nares are without discharge. EENT: normal  Lymph Nodes:  none Neck: Negative for carotid bruits. JVD >10 +HJR Back:without scoliosis kyphosis Lungs: Clear bilaterally to auscultation without wheezes, rales, or rhonchi. Breathing is unlabored. Heart: Irregularly irregular rate and rhythm without murmur . No rubs, or gallops appreciated. Abdomen: Soft, non-tender, non-distended with normoactive bowel sounds. No hepatomegaly. No rebound/guarding. No obvious abdominal masses. Msk:  Strength and tone appear normal for age. Extremities: No clubbing or cyanosis.  1+  edema.  Distal pedal pulses are 2+ and equal bilaterally. Skin: Warm and Dry Neuro: Alert and oriented X 3. CN III-XII intact Grossly normal sensory and motor function . Psych:  Responds to questions appropriately with a normal affect.      Labs: Cardiac Enzymes No results for input(s): CKTOTAL, CKMB, TROPONINI in the last 72 hours. CBC Lab Results  Component Value Date   WBC 8.4  12/24/2020   HGB 14.6 12/24/2020   HCT 45.0 12/24/2020   MCV 82 12/24/2020   PLT 226 12/24/2020   PROTIME: No results for input(s): LABPROT, INR in the last 72 hours. Chemistry No results for input(s): NA, K, CL, CO2, BUN, CREATININE, CALCIUM, PROT, BILITOT, ALKPHOS, ALT, AST, GLUCOSE in the last 168 hours.  Invalid input(s): LABALBU Lipids Lab Results  Component Value Date   CHOL 148 11/16/2020   HDL 23 (L) 11/16/2020   LDLCALC 95 11/16/2020   TRIG 152 (H) 11/16/2020   BNP No results found for: PROBNP Thyroid Function Tests: No results for  input(s): TSH, T4TOTAL, T3FREE, THYROIDAB in the last 72 hours.  Invalid input(s): FREET3 Miscellaneous No results found for: DDIMER  Radiology/Studies:  US RENAL  Result Date: 19-Jan-2021 CLINICAL DATA:  Chronic kidney disease. EXAM: RENAL / URINARY TRACT ULTRASOUND COMPLETE COMPARISON:  None. FINDINGS: Right Kidney: Renal measurements: 10.3 x 5.3 x 5.8 cm = volume: 166 mL. Decreased echogenicity without hydronephrosis. Left Kidney: Renal measurements: 10.1 x 4.5 x 5.0 cm = volume: 120 mL. Increased echogenicity with 5.5 cm well-defined hypoechoic lesion compatible with cyst in the upper pole. Bladder: Appears normal for degree of bladder distention. Other: None. IMPRESSION: 1. No evidence for hydronephrosis. 2. 5.5 cm cyst upper pole left kidney. Electronically Signed   By: Misty Stanley M.D.   On: 01/19/21 14:57   LONG TERM MONITOR (3-14 DAYS)  Result Date: 01/06/2021  The patient was monitored for 3 days, 2 hours.  The predominant rhythm was atrial fibrillation (100% burden) with an average ventricular rate of 83 bpm (range 48-146 bpm).  There were frequent PVC's versus aberrancy (8.5% burden).  No prolonged pauses were observed.  There were no patient triggered events.  Persistent atrial fibrillation (100% burden) with frequent PVC's and/or aberrance.    EKG: Atrial fibrillation at 76 Intervals-/12/44   Assessment and Plan:   Atrial fibrillation-permanent  Cardiomyopathy-ischemic/?  Nonischemic contribution  PVCs-frequent-8.5%  Congestive heart failure-chronic-systolic with orthopnea and bendopnea  Morbid obesity  Sleep apnea-untreated  Renal insufficiency grade 3   The patient has atrial fibrillation with some rapid rates.  With his dyspnea on exertion I wonder to what degree though symptoms can be attenuated by slowing his rate down a little bit more.  He is on high doses of metoprolol; hence, we will try in the context of his cardiomyopathy low-dose digoxin at 0.125 mg daily.  His digoxin level can be measured when he sees RD-PA in a couple of weeks.  His cardiomyopathy is of longstanding but may or may not be variable.  I have wondered whether the measured a normal ejection fraction was an error, but be that as it may, the issue is now related as to whether the PVCs are further aggravating his cardiomyopathy.  Not sure.  Hence, I think it is reasonable to undertake a suppression trial with antiarrhythmic therapy prior to being more aggressive with consideration of catheter ablation.  We will try mexiletine 200 mg twice daily.  Have reviewed side effects.  A repeat Zio patch when he comes back to see already-PA in a couple of weeks would be appropriate  In the event that the PVCs are suppressed repeat assessment of LVEF in about 3 months would follow.  In the event that the PVCs ARE NOT SUPPRESSED would use amiodarone as a suppressing agent, obtaining a Zio patch about a month after initiation.  He has congestive heart failure and is volume overloaded.  We will increase his furosemide from 20 every other day--40 every other day.  Interestingly given his weight, his estimated GFR is about 58.  Hence, he may also be a candidate for spironolactone.  In this regard have encouraged him to work on SGLT2 approval.  Moreover, I think he would benefit from referral to the advanced heart failure clinic at [facility]  We  discussed the role of a defibrillator.    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  We also discussed, Not withstanding his family that he would not  be interested in resuscitation  Encouraged him to reach out to the DME to see whether there are alternative strategies for treating his sleep apnea which he might tolerate.        Virl Axe

## 2021-01-31 NOTE — Telephone Encounter (Signed)
Noted- to Dr. Caryl Comes as an Juluis Rainier.

## 2021-02-03 NOTE — Telephone Encounter (Signed)
Heather,   Can you reach out to his PCP (he is not in Epic), and see if they need to decrease his glimepiride (to avoid hypoglycemia) and semaglutide (nephrotoxic) prior to [place] starting [place]. If they do need to reduce these medications, I would like for them to handle this. Once that is complete, we can then send in [place]. Once Wilder Glade has been started, he will need a BMET 1 week after starting therapy.

## 2021-02-03 NOTE — Telephone Encounter (Signed)
Ryan good morning   can I get you t help with getting him started on farxiga Thanks SK

## 2021-02-03 NOTE — Telephone Encounter (Signed)
Attempted to call the patient first to confirm his PCP and that the current dosing we have on file for glimepiride & semaglautide are correct.  No answer- I left a message to please call back.

## 2021-02-04 NOTE — Progress Notes (Signed)
Cardiology Office Note    Date:  02/09/2021   ID:  Joshua Landry, DOB 01-20-1956, MRN [id number]  PCP:  Joshua Landry, Joshua Landry  Cardiologist:  Nelva Bush, MD  Electrophysiologist:  Virl Axe, MD   Chief Complaint: Follow up  History of Present Illness:   Joshua Landry is a 65 y.o. male with history of CAD status post PCI x2 to the RCA in 2011, permanent A. fib, HFrEF, pulmonary hypertension, CKD stage III, DM2, prostate cancer, HTN, HLD, morbid obesity, and OSA not on CPAPwhopresents for hospital follow-up of his CAD, Afib, and HFrEF.  Hewas admitted to the hospital in 01/2010 with an NSTEMI and underwent PCI/BMS to the proximal RCA. He underwent repeat cath in 7/2011and was found to have significant ISR within the RCA stent and underwent PCI/DES at that time. He was admitted in 11/2010 with another NSTEMI with subsequent cath demonstrating patent RCA stent with a possible thrombus in the PL branch and otherwise nonobstructive disease.  Echo in 12/2014 showed an EF of 20 to 25%, mildly dilated LV cavity size, mild concentric LVH, mildly dilated left atrium, mild mitral regurgitation, small right-sided pleural effusion, and a dilated IVC. When compared to prior echo there had been a significant decline in his EF. In this setting, he underwent cath2/2016thatshowed severe small diagonal branch mid stenosis with mild ISR of the RCA stent with medical management advised.Echo from 2018 showed an EF of 55 to 60%, mildly increased LV septal wall thickness with concentric LVH, mildly dilated RV, mild to moderate right atrium dilatation, mildly dilated left atrium measuring 45 mm, mild dilatation of the ascending aorta measuring 3.6 cm.Review of EKG reports in care everywhere showed his last documented time in sinus rhythm was 2012. Hehasindicatedhis former cardiologist had previously discussed rhythm control strategy though due to his multiple comorbid conditions rate  control strategy was decided upon.   He had been lost to follow-up from his primary cardiology group in [place], [place] since 07/2019, until his recent admission to [facility] in 12/021,in the setting of recently changed jobs and inability to afford insurance during this transition.In this setting he ran out of his medications for approximately 4 weeks prior to his presentation to [facility] on 11/16/2020 with increased shortness of breath and bilateral lower extremity swelling.Upon his arrival he was noted to be volume overloaded and in A. fib with RVR. Initial high-sensitivity troponin 83 with a delta of 65. BNP 244. Symptoms improved with gentle IV diuresis and rate control. Echo showed an EF of 35 to 40%, global hypokinesis, indeterminate LV diastolic function parameters, mildly reduced RV systolic function with normal RV cavity size, moderately dilated left atrium, mildly to moderately dilated right atrium, trivial mitral regurgitation, trivial aortic insufficiency, mild aortic valve sclerosis without evidence of stenosis. He underwent Lexiscan MPI to evaluate for high risk ischemia and LHC was deferred at his request given his comorbid conditions including CKD as indicated he would not want to be on dialysis. Nuclear stress test showed no evidence of ischemia and was an intermediate risk study due to reduced LV systolic function. Prior to discharge,medications were optimized as tolerated.  He was seen in hospital follow up on 11/26/2020 and was doing well from a cardiac perspective. 50 mg of Toprol XL was added in the PM, to further optimize his regimen, and his Lasix was decreased to every other day given acute on CKD.   I last saw him in 12/2020 for routine follow up, at which time he  was doing well from a cardiac perspective. His weight was stable at 270-271 pounds. Given frequent PVCs noted on 12-lead EKG, his Toprol was titrated to 100 mg bid, and he underwent a 3-day Zio monitor that showed 100%  Afub burden with an average ventricular rate of 83 bpm (range 48-146 bpm), frequent PVCs vs aberrancy with an 8.5% burden, and no prolonged pauses.   He was evaluated by EP on 01/27/2021 for evaluation of PVCs in the setting of his underlying cardiomyopathy with recommendation to add digoxin for added rate control of his Afib and management of his cardiomyopathy. With regards to his PVC burden, in the context of his cardiomyopathy, he was placed on mexiletine 200 mg bid with recommendation to repeat Zio patch in follow up. He was felt to be volume up, leading his Lasix to be increased to 40 mg every other day. He has since notified our office he insurance would cover [place], and we have reached out to his PCP to have them assist in adjusting his diabetic medications prior to [place] starting SGLT2i. With regards to ICD, he indicated he is not interested in resuscitation.    He comes in doing well from a cardiac perspective.  No chest pain, dyspnea, palpitations, dizziness, presyncope, or syncope.  He has tolerated the addition of digoxin and mexiletine without adverse effect.  His weight is up 9 pounds when compared to his last visit though he does not feel like he is holding onto excess fluid.  His minimal lower extremity swelling is at baseline and there is no worsening orthopnea or abdominal distention.  No falls, hematochezia, or melena.  He has not missed any doses of his Eliquis.  He does not have any issues or concerns at this time.   Labs independently reviewed: 12/2020 - HGB 14.6, PLT 226, magnesium 2.0, BUN 33, SCr 2.17, potassium 4.6 10/2020- TSH 10.115, free T3 normal, free T4 normal, magnesium 2.1 TC 148, TG 152, HDL 23, LDL 95, A1c 10.3    Past Medical History:  Diagnosis Date  . (HFpEF) heart failure with preserved ejection fraction ([place])   . CAD (coronary artery disease)   . Chronic kidney disease (CKD), stage III (moderate) ([facility])   . Diabetes mellitus without complication ([place])   .  Essential hypertension   . Hyperlipidemia   . Morbid obesity ([place])   . OSA (obstructive sleep apnea)   . Permanent atrial fibrillation ([place])   . Prostate [facility])     Past Surgical History:  Procedure Laterality Date  . WRIST SURGERY Right     Current Medications: Current Meds  Medication Sig  . apixaban (ELIQUIS) 5 MG TABS tablet Take 1 tablet (5 mg total) by mouth 2 (two) times daily.  Marland Kitchen atorvastatin (LIPITOR) 80 MG tablet Take 1 tablet (80 mg total) by mouth at bedtime.  . Cholecalciferol (VITAMIN D3) 10 MCG (400 UNIT) CAPS Take 400 Units by mouth daily.  . dapagliflozin propanediol (FARXIGA) 10 MG TABS tablet Take 1 tablet (10 mg total) by mouth daily before breakfast.  . digoxin (LANOXIN) 0.125 MG tablet Take 1 tablet (0.125 mg total) by mouth daily.  . furosemide (LASIX) 20 MG tablet Take 2 tablets (40 mg) by mouth once EVERY OTHER day  . glimepiride (AMARYL) 1 MG tablet Take 1 mg by mouth daily with breakfast.  . glimepiride (AMARYL) 4 MG tablet Take 4 mg by mouth daily with breakfast.  . isosorbide mononitrate (IMDUR) 30 MG 24 hr tablet Take 1 tablet (30  mg total) by mouth daily.  Marland Kitchen levothyroxine (SYNTHROID) 50 MCG tablet Take 1 tablet (50 mcg total) by mouth daily at 6 (six) AM.  . losartan (COZAAR) 25 MG tablet Take 0.5 tablets (12.5 mg total) by mouth daily.  . metoprolol succinate (TOPROL-XL) 100 MG 24 hr tablet Take 1 tablet (100 mg total) by mouth 2 (two) times daily. Take with or immediately following a meal.  . mexiletine (MEXITIL) 200 MG capsule Take 1 capsule (200 mg total) by mouth 2 (two) times daily.  . nitroGLYCERIN (NITROSTAT) 0.4 MG SL tablet Place 1 tablet (0.4 mg total) under the tongue See admin instructions. Place 1 tablet under the tongue every 5 minutes as needed for chest pain. Max of 3 doses in 15 minutes.  . potassium chloride SA (KLOR-CON) 20 MEQ tablet Take 1 tablet (20 mEq total) by mouth every other day.  . Semaglutide, 1 MG/DOSE, 2 MG/1.5ML SOPN  Inject into the skin once a week.  . tamsulosin (FLOMAX) 0.4 MG CAPS capsule Take 1 capsule (0.4 mg total) by mouth daily.    Allergies:   Prasugrel   Social History   Socioeconomic History  . Marital status: Soil scientist    Spouse name: Not on file  . Number of children: Not on file  . Years of education: Not on file  . Highest education level: Not on file  Occupational History  . Not on file  Tobacco Use  . Smoking status: Never Smoker  . Smokeless tobacco: Current User    Types: Snuff  Vaping Use  . Vaping Use: Never used  Substance and Sexual Activity  . Alcohol use: Yes    Comment: occassional  . Drug use: Never  . Sexual activity: Not Currently  Other Topics Concern  . Not on file  Social History Narrative  . Not on file   Social Determinants of Health   Financial Resource Strain: Not on file  Food Insecurity: Not on file  Transportation Needs: Not on file  Physical Activity: Not on file  Stress: Not on file  Social Connections: Not on file     Family History:  The patient's family history is not on file.  ROS:   Review of Systems  Constitutional: Negative for chills, diaphoresis, fever, malaise/fatigue and weight loss.  HENT: Negative for congestion.   Eyes: Negative for discharge and redness.  Respiratory: Negative for cough, sputum production, shortness of breath and wheezing.   Cardiovascular: Positive for orthopnea and leg swelling. Negative for chest pain, palpitations, claudication and PND.       Stable two-pillow orthopnea  Gastrointestinal: Negative for abdominal pain, blood in stool, heartburn, melena, nausea and vomiting.       [place]  Musculoskeletal: Negative for falls and myalgias.  Skin: Negative for rash.  Neurological: Negative for dizziness, tingling, tremors, sensory change, speech change, focal weakness, loss of consciousness and weakness.  Endo/Heme/Allergies: Does not bruise/bleed easily.  Psychiatric/Behavioral: Negative  for substance abuse. The patient is not nervous/anxious.   All other systems reviewed and are negative.    EKGs/Labs/Other Studies Reviewed:    Studies reviewed were summarized above. The additional studies were reviewed today:  2D echo 12/21/2014 [facility]): Summary:  1. Left ventricle septal thickness is mildly increased. --with mild  concentric LVH  2. The left ventricular chamber size is mildly dilated.  3. Severe global hypokinesis of the left ventricle is observed.  4. The EF is estimated at 20-25%.  5. The left atrium is mildly dilated.  6. There is mild mitral regurgitation observed.  7. There is a small pleural effusion. --right sided  8. The inferior vena cava appears dilated suggesting elevated central  venous pressure. --mild increased  9. Since prior outside echo, there is significant decline in LVEF  10. Incidential gallstone noted  __________  LHC 12/22/2014([facility]): PRESSURES:  RA Pressure: 31 mean mmHG  RV Pressure: 62 mmHG   RVEDP:  32 mmHG  PA Pressure: 64/48 mmHG   Mean PA pressure: 53 mmHG  PCWP: Mean 44 mmHG no V wave  AO pressure: 125/101 mmHG   Mean AO pressure:  081 mmHG  LV systolic pressure: 22 mmHG   LVEDP: 40 mmHG   OXYGEN SATURATIONS:  RA: 49 %  PA:  49 %  Femoral:  94 %  Other: N/A   CARDIAC OUTPUT:  Thermodilution cardiac output:  4.4 L/min  Thermodilution cardiac index:  1.9 L/min/m2  Fickcardiac output:  3.2 L/min  Fick cardiac index:  1.3 L/min/m2    CORONARY ANATOMY:  Left Main: This vessel bifurcated into LAD, small ramus branch, and  moderately sized left circumflex vessel. The left main artery had no  stenosis   Left anterior descending artery: This vessel gave rise to a long 2 mm  small-to medium-sized diagonal branch. The diagonal branch had a 99% mid  stenosis that was not the culprit of the patient's profound  cardiomyopathy. The proximal LAD was patent. The mid LAD had a   moderately long 30-50% stenosis.   Left circumflex artery: This vessel and its large mid obtuse marginal  branch and medium-sized distal obtuse marginal branch was free of disease.   Right coronary artery: This vessel gave rise to a long proximal vessel  stent with mild in-stent restenosis. The remainder of the RCA and its  medium-sized PDA and small-to medium-sized posterior lateral branch were  free of disease.    LEFT VENTRICULOGRAM: This was performed by hand injection to spare dye.   There is underfilling of the ventricle showing severe LV chamber  enlargement with severe global hypokinesis and left ventricular ejection  fraction less than 20%. There was inability to use assess for mitral  regurgitation or aortic size.   CONCLUSIONS:  1. Severe small vessel disease of a single branch otherwise mild LAD  disease and very mild in-stent restenosis of the right coronary artery;  the cardiomyopathy is out of proportion to severity of coronary disease  2. Severe LV systolic dysfunction  3. Profound volume overload by right heart catheterization with marginal  cardiac index    PLAN:   1. IV Lasix diuresis  2. Continue to advance carvedilol and ACE inhibitor therapy ___________  2D echo 10/23/2017([facility]): Summary:  1. Left ventricle septal thickness is mildly increased. --with  concentric LVH  2. The EF is estimated at 55-60%.  3. Abnormal diastolic filling pattern consistent with underlying atrial  fibrillation was observed. --with heart rate 66 to 94 bpm  4. The right ventricular cavity size is mildly enlarged.  5. The right atrium is mild to moderately dilated.   6. The inferior vena cava is not visualized.  7. There is mild dilatation of the ascending aorta. --at 3.6 cm   Recommendation:  1. Consider non compilance of OSA therapy contributing as an etiology  for recent fluid retention symptoms __________  2D echo 11/16/2020: 1. Left ventricular  ejection fraction, by estimation, is 35 to 40%. The  left ventricle has moderately decreased function. The left ventricle  demonstrates global hypokinesis.  There is mild left ventricular  hypertrophy. Left ventricular diastolic  parameters are indeterminate.  2. Right ventricular systolic function is mildly reduced. The right  ventricular size is normal. Tricuspid regurgitation signal is inadequate  for assessing PA pressure.  3. Left atrial size was moderately dilated.  4. Right atrial size was mild to moderately dilated.  5. The mitral valve is normal in structure. Trivial mitral valve  regurgitation. No evidence of mitral stenosis.  6. The aortic valve is tricuspid. There is mild thickening of the aortic  valve. Aortic valve regurgitation is trivial. Mild aortic valve sclerosis  is present, with no evidence of aortic valve stenosis. __________  [facility] Adam MPI 11/17/2020:  There was no ST segment deviation noted during stress.  No T wave inversion was noted during stress.  This is an intermediate risk study due to reduced ejection fraction.  The left ventricular ejection fraction is moderately decreased visually (35%). measured EF was 20%. correlation with echo advised  There was no evidence for ischemia __________  Zio patch 12/2020:  The patient was monitored for 3 days, 2 hours.  The predominant rhythm was atrial fibrillation (100% burden) with an average ventricular rate of 83 bpm (range 48-146 bpm).  There were frequent PVC's versus aberrancy (8.5% burden).  No prolonged pauses were observed.  There were no patient triggered events.   Persistent atrial fibrillation (100% burden) with frequent PVC's and/or aberrance.   EKG:  EKG is ordered today.  The EKG ordered today demonstrates A. fib, 73 bpm, low voltage QRS, poor R wave progression precordial leads, possible prior anterior infarct, no acute ST-T changes, when compared to prior tracing no significant  changes  Recent Labs: 11/16/2020: B Natriuretic Peptide 244.1; TSH 10.115 12/24/2020: BUN 33; Creatinine, Ser 2.17; Hemoglobin 14.6; Magnesium 2.0; Platelets 226; Potassium 4.6; Sodium 136  Recent Lipid Panel    Component Value Date/Time   CHOL 148 11/16/2020 0641   TRIG 152 (H) 11/16/2020 0641   HDL 23 (L) 11/16/2020 0641   CHOLHDL 6.4 11/16/2020 0641   VLDL 30 11/16/2020 0641   LDLCALC 95 11/16/2020 0641    PHYSICAL EXAM:    VS:  BP 130/80 (BP Location: Left Arm, Patient Position: Sitting, Cuff Size: Large)   Pulse 73   Ht 5\' 11"  (1.803 m)   Wt 284 lb (128.8 kg)   SpO2 98%   BMI 39.61 kg/m   BMI: Body mass index is 39.61 kg/m.  Physical Exam Vitals reviewed.  Constitutional:      Appearance: He is well-developed.  HENT:     Head: Normocephalic and atraumatic.  Eyes:     General:        Right eye: No discharge.        Left eye: No discharge.  Neck:     Vascular: No JVD.  Cardiovascular:     Rate and Rhythm: Normal rate. Rhythm irregularly irregular.     Pulses: No midsystolic click and no opening snap.          Posterior tibial pulses are 2+ on the right side and 2+ on the left side.     Heart sounds: Normal heart sounds, S1 normal and S2 normal. Heart sounds not distant. No murmur heard. No friction rub.  Pulmonary:     Effort: Pulmonary effort is normal. No respiratory distress.     Breath sounds: Normal breath sounds. No decreased breath sounds, wheezing or rales.  Chest:     Chest wall: No tenderness.  Abdominal:  General: There is no distension.     Palpations: Abdomen is soft.     Tenderness: There is no abdominal tenderness.  Musculoskeletal:     Cervical back: Normal range of motion.     Right lower leg: Edema present.     Left lower leg: Edema present.     Comments: Trace bilateral tibial edema  Skin:    General: Skin is warm and dry.     Nails: There is no clubbing.  Neurological:     Mental Status: He is alert and oriented to person, place,  and time.  Psychiatric:        Speech: Speech normal.        Behavior: Behavior normal.        Thought Content: Thought content normal.        Judgment: Judgment normal.     Wt Readings from Last 3 Encounters:  02/09/21 284 lb (128.8 kg)  01/27/21 275 lb 6 oz (124.9 kg)  12/24/20 270 lb (122.5 kg)     ASSESSMENT & PLAN:   1. CAD involving the native coronary arteries without angina: He is doing well and is without symptoms concerning for angina.  Recent Lexiscan MPI demonstrated no evidence of ischemia or scar.  Continue secondary prevention and current medical therapy including atorvastatin, Toprol-XL, and losartan.  He is on Eliquis in place of aspirin.  Continue to optimize medical therapy over the next couple of months as outlined below with a repeat limited echo to evaluate for improvement in his LV systolic function.  Should despite PVC suppression and optimization of maximally tolerated GDMT his cardiomyopathy persists we would need to consider diagnostic R/LHC if his renal function allows.  At this time, there is no indication for further ischemic testing while we await repeat echo.  2. HFrEF: He appears euvolemic and well compensated with NYHA class II symptoms.  Add Farxiga 10 mg daily.  Diabetic precautions discussed in detail.  Follow-up BMP 1 week after starting Farxiga.  Await callback from patient's PCPs office regarding potential dosage changes in his underlying diabetic regimen with the addition of Farxiga.  Relative hypotension has precluded transition of losartan to [facility] historically however if his BP continues to run on the slightly higher side we should look to make this transition at his next visit.  For now he will continue Toprol-XL, losartan and Lasix.  He is not on an MRA secondary to underlying CKD.  We will plan to update a limited echo in several months time to evaluate his LV systolic function following optimization of maximally tolerated GDMT and PVC burden  suppression.  If his cardiomyopathy persists we may need to pursue diagnostic R/LHC, if renal function allows.  CHF education.   3. Permanent Afib: Ventricular rates are well controlled.  He remains on Toprol XL 100 mg bid for rate control as well as recently started digoxin.  Check BMP and digoxin level.  He remains on setting of CHA2DS2-VASc of 5.  No symptoms concerning for bleeding.  Recent CBC demonstrated hemoglobin as outlined above.  His last EKG that demonstrates sinus rhythm is dated 2012, rate control strategy will be continued.   4. Frequent PVCs: No further PVC burden noted on twelve-lead EKG in the office today following addition of mexiletine which she seems to be tolerating well.  Recent outpatient cardiac monitoring demonstrated AN 8.5% PVC burden.  He was recently evaluated by EP earlier this month and placed on mexiletine to suppress losartan.  Follow-up in  3-day Zio patch to evaluate for improved PVC burden on current medical therapy.  At this time, it is difficult to say if his PVC burden is in the context of his cardiomyopathy or if his PVC burden is contributory to his cardiomyopathy.  Check BMP and magnesium.  Follow-up with EP as directed.  5. Pulmonary hypertension: Likely in the setting of underlying OSA with obesity with possible OHS.  Continued use of CPAP recommended.  6. CKD stage III: Check BMP.  Followed by nephrology.  7. HTN: Blood pressure is reasonably controlled today.  Continue current medical therapy as outlined above.  8. HLD: LDL 95 in 10/2020, after being off medications for approximately 4 weeks at that time.  Goal LDL < 70.   9. OSA: CPAP.  10. DM: Hypoglycemia precautions discussed in detail.  Follow-up with PCP as directed.  Disposition: F/u with Dr. Saunders Revel or an APP in 1 month, and EP as directed.   Medication Adjustments/Labs and Tests Ordered: Current medicines are reviewed at length with the patient today.  Concerns regarding medicines are  outlined above. Medication changes, Labs and Tests ordered today are summarized above and listed in the Patient Instructions accessible in Encounters.   Signed, Christell Faith, PA-C 02/09/2021 9:01 AM     [facility] [address] [place] [address] [place], [place] [id number] [phone]

## 2021-02-07 NOTE — Telephone Encounter (Signed)
Attempted to call the patient to confirm his PCP and advise that I will be reaching out to them to discuss if any other changes need to be made to current diabetic medications as we start Farxiga.   No answer- I left a message to please call back.

## 2021-02-07 NOTE — Telephone Encounter (Signed)
Patient returning call.

## 2021-02-07 NOTE — Telephone Encounter (Addendum)
I called the patient back and was able to speak with him. I advised him Dr. Luan Pulling, PA had gotten the message that he would be able to take farxiga 10 mg once daily.  I also advised the patient I wanted to confirm his PCP as I will need to reach out to them to make them aware he is going to be starting farxiga and if anything different needs to be done with his glimepiride/ semaglutide, to please reach out to him and let him know.  Per the patient, he has not been consistently taking semaglutide as this is a once weekly injectable and he often forgets.  I advised I will still be in contact with his PCP- confirmed this is Rose Phi, FNP at [facility].   I called and spoke with Rodena Piety, Midwife at [place] office. I have advised her we are starting farxiga 10 mg once daily and could she please ask Annie Main to weigh in on whether the glimepiride / semaglutide need to be decreased. I have also advised Rodena Piety- the patient has admitted to missing multiple doses of the semaglutide as this is once a week and he forgets.   Rodena Piety voices understanding and will relay the message to Annie Main, NP.

## 2021-02-08 NOTE — Telephone Encounter (Signed)
Will address at follow up [place].

## 2021-02-09 ENCOUNTER — Other Ambulatory Visit: Payer: Self-pay

## 2021-02-09 ENCOUNTER — Other Ambulatory Visit
Admission: RE | Admit: 2021-02-09 | Discharge: 2021-02-09 | Disposition: A | Discharge status: Home / Self Care | Payer: Commercial Managed Care - PPO | Attending: Physician Assistant | Admitting: Physician Assistant

## 2021-02-09 ENCOUNTER — Ambulatory Visit (INDEPENDENT_AMBULATORY_CARE_PROVIDER_SITE_OTHER): Payer: Commercial Managed Care - PPO

## 2021-02-09 ENCOUNTER — Telehealth: Payer: Self-pay | Admitting: *Deleted

## 2021-02-09 ENCOUNTER — Encounter: Payer: Self-pay | Admitting: Physician Assistant

## 2021-02-09 ENCOUNTER — Ambulatory Visit (INDEPENDENT_AMBULATORY_CARE_PROVIDER_SITE_OTHER): Payer: Commercial Managed Care - PPO | Admitting: Physician Assistant

## 2021-02-09 VITALS — BP 130/80 | HR 73 | Ht 71.0 in | Wt 284.0 lb

## 2021-02-09 DIAGNOSIS — E785 Hyperlipidemia, unspecified: Secondary | ICD-10-CM

## 2021-02-09 DIAGNOSIS — G4733 Obstructive sleep apnea (adult) (pediatric): Secondary | ICD-10-CM

## 2021-02-09 DIAGNOSIS — I5020 Unspecified systolic (congestive) heart failure: Secondary | ICD-10-CM | POA: Diagnosis not present

## 2021-02-09 DIAGNOSIS — I10 Essential (primary) hypertension: Secondary | ICD-10-CM

## 2021-02-09 DIAGNOSIS — I2510 Atherosclerotic heart disease of native coronary artery without angina pectoris: Secondary | ICD-10-CM

## 2021-02-09 DIAGNOSIS — I4821 Permanent atrial fibrillation: Secondary | ICD-10-CM | POA: Diagnosis not present

## 2021-02-09 DIAGNOSIS — I493 Ventricular premature depolarization: Secondary | ICD-10-CM

## 2021-02-09 DIAGNOSIS — N183 Chronic kidney disease, stage 3 unspecified: Secondary | ICD-10-CM

## 2021-02-09 DIAGNOSIS — I272 Pulmonary hypertension, unspecified: Secondary | ICD-10-CM

## 2021-02-09 LAB — BASIC METABOLIC PANEL
Anion gap: 8 (ref 5–15)
BUN: 37 mg/dL — ABNORMAL HIGH (ref 8–23)
CO2: 22 mmol/L (ref 22–32)
Calcium: 9 mg/dL (ref 8.9–10.3)
Chloride: 108 mmol/L (ref 98–111)
Creatinine, Ser: 2.17 mg/dL — ABNORMAL HIGH (ref 0.61–1.24)
GFR, Estimated: 33 mL/min — ABNORMAL LOW (ref >60)
Glucose, Bld: 125 mg/dL — ABNORMAL HIGH (ref 70–99)
Potassium: 4.7 mmol/L (ref 3.5–5.1)
Sodium: 138 mmol/L (ref 135–145)

## 2021-02-09 LAB — BRAIN NATRIURETIC PEPTIDE: B Natriuretic Peptide: 262.5 pg/mL — ABNORMAL HIGH (ref 0.0–100.0)

## 2021-02-09 LAB — DIGOXIN LEVEL: Digoxin Level: 0.6 ng/mL — ABNORMAL LOW (ref 0.8–2.0)

## 2021-02-09 LAB — MAGNESIUM: Magnesium: 1.9 mg/dL (ref 1.7–2.4)

## 2021-02-09 MED ORDER — DAPAGLIFLOZIN PROPANEDIOL 10 MG PO TABS: 10.0000 mg | ORAL_TABLET | Freq: Every day | ORAL | 6 refills | Status: DC

## 2021-02-09 NOTE — Telephone Encounter (Signed)
Results reviewed with patient and he verbalized understanding with no further questions at this time.

## 2021-02-09 NOTE — Telephone Encounter (Signed)
Left voicemail message to call me back for lab results and that they are also available on My Chart

## 2021-02-09 NOTE — Patient Instructions (Addendum)
Medication Instructions:  Your physician has recommended you make the following change in your medication:   1. START Farxiga 10 mg once daily   *If you need a refill on your cardiac medications before your next appointment, please call your pharmacy*   Lab Work: BMET, Mag, BNP, Dig level, today  In one week Wednesday 02/16/21 go to [facility] to have repeat BMET done. No appointment is needed and just check in at registration.   If you have labs (blood work) drawn today and your tests are completely normal, you will receive your results only by: Marland Kitchen MyChart Message (if you have MyChart) OR . A paper copy in the mail If you have any lab test that is abnormal or we need to change your treatment, we will call you to review the results.   Testing/Procedures: Your physician has recommended that you wear a Zio monitor for 3 days. Remove on 02/12/21 and mail back to company in box provided.   This monitor is a medical device that records the heart's electrical activity. Doctors most often use these monitors to diagnose arrhythmias. Arrhythmias are problems with the speed or rhythm of the heartbeat. The monitor is a small device applied to your chest. You can wear one while you do your normal daily activities. While wearing this monitor if you have any symptoms to push the button and record what you felt. Once you have worn this monitor for the period of time provider prescribed (Usually 14 days), you will return the monitor device in the postage paid box. Once it is returned they will download the data collected and provide [place] with a report which the provider will then review and we will call you with those results. Important tips:  1. Avoid showering during the first 24 hours of wearing the monitor. 2. Avoid excessive sweating to help maximize wear time. 3. Do not submerge the device, no hot tubs, and no swimming pools. 4. Keep any lotions or oils away from the patch. 5. After 24 hours you  may shower with the patch on. Take brief showers with your back facing the shower head.  6. Do not remove patch once it has been placed because that will interrupt data and decrease adhesive wear time. 7. Push the button when you have any symptoms and write down what you were feeling. 8. Once you have completed wearing your monitor, remove and place into box which has postage paid and place in your outgoing mailbox.  9. If for some reason you have misplaced your box then call our office and we can provide another box and/or mail it off for you.         Follow-Up: At [facility], you and your health needs are our priority.  As part of our continuing mission to provide you with exceptional heart care, we have created designated Provider Care Teams.  These Care Teams include your primary Cardiologist (physician) and Advanced Practice Providers (APPs -  Physician Assistants and Nurse Practitioners) who all work together to provide you with the care you need, when you need it.   Your next appointment:   1 month(s)  The format for your next appointment:   In Person  Provider:   Nelva Bush, MD or Christell Faith, PA-C

## 2021-02-09 NOTE — Telephone Encounter (Signed)
-----   Message from Rise Mu, [place] sent at 02/09/2021 12:49 PM EDT ----- Digoxin level is ok BNP is mildly elevated and consistent with prior reading Renal function remains mildly elevated, though is stable and at his ~ baseline Potassium is at goal Random glucose is mildly elevated with known diabetes Magnesium is normal  No changes

## 2021-02-16 ENCOUNTER — Other Ambulatory Visit: Payer: Self-pay

## 2021-02-16 ENCOUNTER — Other Ambulatory Visit
Admission: RE | Admit: 2021-02-16 | Discharge: 2021-02-16 | Disposition: A | Discharge status: Home / Self Care | Payer: Commercial Managed Care - PPO | Source: Ambulatory Visit | Attending: Physician Assistant | Admitting: Physician Assistant

## 2021-02-16 DIAGNOSIS — I5020 Unspecified systolic (congestive) heart failure: Secondary | ICD-10-CM

## 2021-02-16 DIAGNOSIS — I4821 Permanent atrial fibrillation: Secondary | ICD-10-CM

## 2021-02-16 LAB — BASIC METABOLIC PANEL
Anion gap: 6 (ref 5–15)
BUN: 35 mg/dL — ABNORMAL HIGH (ref 8–23)
CO2: 25 mmol/L (ref 22–32)
Calcium: 9 mg/dL (ref 8.9–10.3)
Chloride: 107 mmol/L (ref 98–111)
Creatinine, Ser: 2.28 mg/dL — ABNORMAL HIGH (ref 0.61–1.24)
GFR, Estimated: 31 mL/min — ABNORMAL LOW (ref >60)
Glucose, Bld: 135 mg/dL — ABNORMAL HIGH (ref 70–99)
Potassium: 4.5 mmol/L (ref 3.5–5.1)
Sodium: 138 mmol/L (ref 135–145)

## 2021-02-28 ENCOUNTER — Telehealth: Payer: Self-pay | Admitting: Internal Medicine

## 2021-02-28 MED ORDER — FUROSEMIDE 20 MG PO TABS: ORAL_TABLET | ORAL | 3 refills | Status: DC

## 2021-02-28 NOTE — Telephone Encounter (Signed)
*  STAT* If patient is at the pharmacy, call can be transferred to refill team.   1. Which medications need to be refilled? (please list name of each medication and dose if known)  Lasix 20 MG 2 tablets every other day - 20 MG PRN  2. Which pharmacy/location (including street and city if local pharmacy) is medication to be sent to? [facility] in [place]   3. Do they need a 30 day or 90 day supply? 90 day

## 2021-03-01 ENCOUNTER — Telehealth: Payer: Self-pay | Admitting: Internal Medicine

## 2021-03-01 NOTE — Telephone Encounter (Signed)
Valora Corporal, RN  02/28/2021 9:26 AM EDT Back to Top     Reviewed results with patient and he verbalized understanding with no further questions at this time.

## 2021-03-01 NOTE — Telephone Encounter (Signed)
Rise Mu, PA-C  02/28/2021 7:21 AM EDT      Cardiac monitor showed a predominant rhythm of Afib (known permanent Afib) with an average ventricular rate of 71 bpm (range 30-120 bpm), occasional PVCs with a 1.8%burden and a single 3.6 second pause was noted occurring at 6:12 PM.   When compared to prior outpatient cardiac monitor, his PVC burden is improved. He should continue current medications and follow up with Dr. Caryl Comes as directed.

## 2021-03-01 NOTE — Telephone Encounter (Signed)
I reviewed the patient's ZIO monitor results with Dr. Caryl Comes as resulted per Christell Faith, PA. PVC's have improved from 8.5% in February >> 1.8 % on his current monitor. Dr. Caryl Comes had added mexiletine 200 mg BID & Digoxin 1.25 mg QD on 01/27/21.  Per Dr. Caryl Comes, with improved PVC burden and up to date lab work for initiation of digoxin, the patient should not need the appointment with Christell Faith, PA on 03/11/21.  He should have: 1) a repeat echocardiogram in 3 months (July) 2) see Dr. Ernie Avena, PA just after the echo is completed in July 3) EP follow up as needed   I have attempted to call the patient to notify him of the above MD recommendations. No answer- I left a message to please call back.

## 2021-03-01 NOTE — Telephone Encounter (Signed)
RD wants him to follow up with Dr. Caryl Comes as directed but I do not see where he has a follow up. He wore Zio and results are back showing predominant rhythm of Afib. He has upcoming appointment with [place] next Friday.     Olin Hauser

## 2021-03-03 NOTE — Telephone Encounter (Signed)
Attempted to call the patient. No answer- I left a message to please call back.  

## 2021-03-08 ENCOUNTER — Other Ambulatory Visit: Payer: Self-pay | Admitting: *Deleted

## 2021-03-08 DIAGNOSIS — C61 Malignant neoplasm of prostate: Secondary | ICD-10-CM

## 2021-03-08 NOTE — Progress Notes (Signed)
Cardiology Office Note    Date:  03/11/2021   ID:  Joshua Landry, DOB 05-Jun-1956, MRN [id number]  PCP:  Rose Phi, [place]  Cardiologist:  Nelva Bush, MD  Electrophysiologist:  Virl Axe, MD   Chief Complaint: Follow up  History of Present Illness:   Joshua Landry is a 65 y.o. male with history of CAD status post PCI x2 to the RCA in 2011, permanent A. fib, HFrEF, pulmonary hypertension, CKD stage III, DM2, prostate cancer, HTN, HLD, morbid obesity, and OSA not on CPAPwhopresents for hospital follow-upofhis CAD, Afib, and HFrEF.  Hewas admitted to the hospital in 01/2010 with an NSTEMI and underwent PCI/BMS to the proximal RCA. He underwent repeat cath in 7/2011and was found to have significant ISR within the RCA stent and underwent PCI/DES at that time. He was admitted in 11/2010 with another NSTEMI with subsequent cath demonstrating patent RCA stent with a possible thrombus in the PL branch and otherwise nonobstructive disease. Echo in 12/2014 showed an EF of 20 to 25%, mildly dilated LV cavity size, mild concentric LVH, mildly dilated left atrium, mild mitral regurgitation, small right-sided pleural effusion, and a dilated IVC. When compared to prior echo there hadbeen a significant decline in his EF. In this setting,he underwent cath2/2016thatshowed severe small diagonal branch mid stenosis with mild ISR of the RCA stent with medical management advised.Echo from 2018 showed an EF of 55 to 60%, mildly increased LV septal wall thickness with concentric LVH, mildly dilated RV, mild to moderate right atrium dilatation, mildly dilated left atrium measuring 45 mm, mild dilatation of the ascending aorta measuring 3.6 cm.Review of EKG reports in care everywhere showedhis last documented time in sinus rhythm was 2012. Hehasindicatedhis former cardiologist had previously discussed rhythm control strategy though due to his multiple comorbid conditions rate  control strategy was decided upon.   He hadbeen lost to follow-up from his primary cardiology group in [place], [place] since 07/2019, until his recent admission to [facility] in 12/021,in the setting of recently changed jobs and inability to afford insurance during this transition.In this setting he ran out of his medications for approximately 4 weeks prior to his presentation to [facility] on 11/16/2020 with increased shortness of breath and bilateral lower extremity swelling.Upon his arrival he was noted to be volume overloaded and in A. fib with RVR. Initial high-sensitivity troponin 83 with a delta of 65. BNP 244. Symptoms improved with gentle IV diuresis and rate control. Echo showed an EF of 35 to 40%, global hypokinesis, indeterminate LV diastolic function parameters, mildly reduced RV systolic function with normal RV cavity size, moderately dilated left atrium, mildly to moderately dilated right atrium, trivial mitral regurgitation, trivial aortic insufficiency, mild aortic valve sclerosis without evidence of stenosis. He underwent Lexiscan MPI to evaluate for high risk ischemia and LHC was deferred at his request given his comorbid conditions including CKD as indicated he would not want to be on dialysis. Nuclear stress test showed no evidence of ischemia and was an intermediate risk study due to reduced LV systolic function. Prior to discharge,medications were optimized as tolerated.He was seen in hospital follow up on 11/26/2020 and was doing well from a cardiac perspective. 50 mg of Toprol XL was added in the PM, to further optimize his regimen, and his Lasix was decreased to every other day given acute on CKD.  He was seen in 12/2020, for routine follow up, at which time he was doing well from a cardiac perspective. His weight was stable  at 270-271 pounds. Given frequent PVCs noted on 12-lead EKG, his Toprol was titrated to 100 mg bid, and he underwent a 3-day Zio monitor that showed 100%  Afub burden with an average ventricular rate of 83 bpm (range 48-146 bpm), frequent PVCs vs aberrancy with an 8.5% burden, and no prolonged pauses.   He was evaluated by EP on 01/27/2021 for evaluation of PVCs in the setting of his underlying cardiomyopathy with recommendation to add digoxin for added rate control of his Afib and management of his cardiomyopathy. With regards to his PVC burden, in the context of his cardiomyopathy, he was placed on mexiletine 200 mg bid with recommendation to repeat Zio patch in follow up. He was felt to be volume up, leading his Lasix to be increased to 40 mg every other day.  With regards to ICD, he indicated he is not interested in resuscitation.   I last saw him on 02/09/2021, at which time he was doing well from a cardiac perspective.  He was tolerating mexiletine.  His weight was up 9 pounds, though he did not feel like he was volume up.  He was started on Farxiga.  Otherwise, relative hypotension precluded further escalation of GDMT.  Repeat outpatient cardiac montioring, on mexiletine, showed Afib with a 100% burden, average ventricular rate 71 bpm (range 30-120 bpm), occasional PVCs with an improved burden of 1.8%, and a single 3.6 second pause noted at 6:12 PM.   He comes in doing well from a cardiac perspective.  He notes stable chronic shortness of breath.  No chest pain, palpitations, dizziness, presyncope, syncope, or lower extremity swelling.  By our scales, his weight is down 13 pounds when compared to his clinic visit on 02/09/2021.  He is tolerating all medications without issues.  He is watching his salt and fluid intake.  He continues to prefer a less aggressive approach to his medical care and declines A. fib clinic evaluation as well as ICD.  He does not have any issues or concerns at this time.   Labs independently reviewed: 02/2021 - TSH normal, A1c 8.0 01/2021 - digoxin 0.6 12/2020 - HGB 14.6, PLT 226, magnesium 2.0, BUN 33, SCr 2.17, potassium  4.6 10/2020- magnesium 2.1 TC 148, TG 152, HDL 23, LDL 95  Past Medical History:  Diagnosis Date  . (HFpEF) heart failure with preserved ejection fraction ([place])   . CAD (coronary artery disease)   . Chronic kidney disease (CKD), stage III (moderate) (H[facility])   . Diabetes mellitus without complication ([place])   . Essential hypertension   . Hyperlipidemia   . Morbid obesity ([place])   . OSA (obstructive sleep apnea)   . Permanent atrial fibrillation ([place])   . Prostate CA [facility])     Past Surgical History:  Procedure Laterality Date  . WRIST SURGERY Right     Current Medications: Current Meds  Medication Sig  . apixaban (ELIQUIS) 5 MG TABS tablet Take 1 tablet (5 mg total) by mouth 2 (two) times daily.  Marland Kitchen atorvastatin (LIPITOR) 80 MG tablet Take 1 tablet (80 mg total) by mouth at bedtime.  . Cholecalciferol (VITAMIN D3) 10 MCG (400 UNIT) CAPS Take 400 Units by mouth daily.  . dapagliflozin propanediol (FARXIGA) 10 MG TABS tablet Take 1 tablet (10 mg total) by mouth daily before breakfast.  . digoxin (LANOXIN) 0.125 MG tablet Take 1 tablet (0.125 mg total) by mouth daily.  Marland Kitchen glimepiride (AMARYL) 1 MG tablet Take 1 mg by mouth daily with breakfast.  .  isosorbide mononitrate (IMDUR) 30 MG 24 hr tablet Take 1 tablet (30 mg total) by mouth daily.  Marland Kitchen levothyroxine (SYNTHROID) 50 MCG tablet Take 1 tablet (50 mcg total) by mouth daily at 6 (six) AM.  . losartan (COZAAR) 25 MG tablet Take 0.5 tablets (12.5 mg total) by mouth daily.  . metoprolol succinate (TOPROL-XL) 100 MG 24 hr tablet Take 1 tablet (100 mg total) by mouth 2 (two) times daily. Take with or immediately following a meal.  . mexiletine (MEXITIL) 200 MG capsule Take 1 capsule (200 mg total) by mouth 2 (two) times daily.  . nitroGLYCERIN (NITROSTAT) 0.4 MG SL tablet Place 1 tablet (0.4 mg total) under the tongue See admin instructions. Place 1 tablet under the tongue every 5 minutes as needed for chest pain. Max of 3 doses in 15 minutes.   Marland Kitchen OZEMPIC, 1 MG/DOSE, 4 MG/3ML SOPN Inject 1 mg into the skin once a week.  . tamsulosin (FLOMAX) 0.4 MG CAPS capsule Take 1 capsule (0.4 mg total) by mouth daily.  . [DISCONTINUED] furosemide (LASIX) 20 MG tablet Take 2 tablets (40 mg) by mouth once EVERY OTHER day  . [DISCONTINUED] potassium chloride SA (KLOR-CON) 20 MEQ tablet Take 1 tablet (20 mEq total) by mouth every other day.    Allergies:   Prasugrel   Social History   Socioeconomic History  . Marital status: Soil scientist    Spouse name: Not on file  . Number of children: Not on file  . Years of education: Not on file  . Highest education level: Not on file  Occupational History  . Not on file  Tobacco Use  . Smoking status: Never Smoker  . Smokeless tobacco: Current User    Types: Snuff  Vaping Use  . Vaping Use: Never used  Substance and Sexual Activity  . Alcohol use: Yes    Comment: occassional  . Drug use: Never  . Sexual activity: Not Currently  Other Topics Concern  . Not on file  Social History Narrative  . Not on file   Social Determinants of Health   Financial Resource Strain: Not on file  Food Insecurity: Not on file  Transportation Needs: Not on file  Physical Activity: Not on file  Stress: Not on file  Social Connections: Not on file     Family History:  The patient's family history is not on file.  ROS:   Review of Systems  Constitutional: Negative for chills, diaphoresis, fever, malaise/fatigue and weight loss.  HENT: Negative for congestion.   Eyes: Negative for discharge and redness.  Respiratory: Positive for shortness of breath. Negative for cough, sputum production and wheezing.   Cardiovascular: Negative for chest pain, palpitations, orthopnea, claudication, leg swelling and PND.  Gastrointestinal: Negative for abdominal pain, blood in stool, heartburn, melena, nausea and vomiting.  Musculoskeletal: Positive for joint pain. Negative for falls and myalgias.       Knee pain   Skin: Negative for rash.  Neurological: Negative for dizziness, tingling, tremors, sensory change, speech change, focal weakness, loss of consciousness and weakness.  Endo/Heme/Allergies: Does not bruise/bleed easily.  Psychiatric/Behavioral: Negative for substance abuse. The patient is not nervous/anxious.   All other systems reviewed and are negative.    EKGs/Labs/Other Studies Reviewed:    Studies reviewed were summarized above. The additional studies were reviewed today:  2D echo 12/21/2014 [facility]): Summary:  1. Left ventricle septal thickness is mildly increased. --with mild  concentric LVH  2. The left ventricular chamber size is mildly  dilated.  3. Severe global hypokinesis of the left ventricle is observed.  4. The EF is estimated at 20-25%.  5. The left atrium is mildly dilated.  6. There is mild mitral regurgitation observed.  7. There is a small pleural effusion. --right sided  8. The inferior vena cava appears dilated suggesting elevated central  venous pressure. --mild increased  9. Since prior outside echo, there is significant decline in LVEF  10. Incidential gallstone noted  __________  LHC 12/22/2014([facility]): PRESSURES:  RA Pressure: 31 mean mmHG  RV Pressure: 62 mmHG   RVEDP:  32 mmHG  PA Pressure: 64/48 mmHG   Mean PA pressure: 53 mmHG  PCWP: Mean 44 mmHG no V wave  AO pressure: 125/101 mmHG   Mean AO pressure:  659 mmHG  LV systolic pressure: 22 mmHG   LVEDP: 40 mmHG   OXYGEN SATURATIONS:  RA: 49 %  PA:  49 %  Femoral:  94 %  Other: N/A   CARDIAC OUTPUT:  Thermodilution cardiac output:  4.4 L/min  Thermodilution cardiac index:  1.9 L/min/m2  Fickcardiac output:  3.2 L/min  Fick cardiac index:  1.3 L/min/m2    CORONARY ANATOMY:  Left Main: This vessel bifurcated into LAD, small ramus branch, and  moderately sized left circumflex vessel. The left main artery had no  stenosis   Left anterior descending  artery: This vessel gave rise to a long 2 mm  small-to medium-sized diagonal branch. The diagonal branch had a 99% mid  stenosis that was not the culprit of the patient's profound  cardiomyopathy. The proximal LAD was patent. The mid LAD had a  moderately long 30-50% stenosis.   Left circumflex artery: This vessel and its large mid obtuse marginal  branch and medium-sized distal obtuse marginal branch was free of disease.   Right coronary artery: This vessel gave rise to a long proximal vessel  stent with mild in-stent restenosis. The remainder of the RCA and its  medium-sized PDA and small-to medium-sized posterior lateral branch were  free of disease.    LEFT VENTRICULOGRAM: This was performed by hand injection to spare dye.   There is underfilling of the ventricle showing severe LV chamber  enlargement with severe global hypokinesis and left ventricular ejection  fraction less than 20%. There was inability to use assess for mitral  regurgitation or aortic size.   CONCLUSIONS:  1. Severe small vessel disease of a single branch otherwise mild LAD  disease and very mild in-stent restenosis of the right coronary artery;  the cardiomyopathy is out of proportion to severity of coronary disease  2. Severe LV systolic dysfunction  3. Profound volume overload by right heart catheterization with marginal  cardiac index    PLAN:   1. IV Lasix diuresis  2. Continue to advance carvedilol and ACE inhibitor therapy ___________  2D echo 10/23/2017([facility]): Summary:  1. Left ventricle septal thickness is mildly increased. --with  concentric LVH  2. The EF is estimated at 55-60%.  3. Abnormal diastolic filling pattern consistent with underlying atrial  fibrillation was observed. --with heart rate 66 to 94 bpm  4. The right ventricular cavity size is mildly enlarged.  5. The right atrium is mild to moderately dilated.   6. The inferior vena cava is not visualized.  7.  There is mild dilatation of the ascending aorta. --at 3.6 cm   Recommendation:  1. Consider non compilance of OSA therapy contributing as an etiology  for recent fluid retention symptoms __________  2D echo 11/16/2020: 1. Left ventricular ejection fraction, by estimation, is 35 to 40%. The  left ventricle has moderately decreased function. The left ventricle  demonstrates global hypokinesis. There is mild left ventricular  hypertrophy. Left ventricular diastolic  parameters are indeterminate.  2. Right ventricular systolic function is mildly reduced. The right  ventricular size is normal. Tricuspid regurgitation signal is inadequate  for assessing PA pressure.  3. Left atrial size was moderately dilated.  4. Right atrial size was mild to moderately dilated.  5. The mitral valve is normal in structure. Trivial mitral valve  regurgitation. No evidence of mitral stenosis.  6. The aortic valve is tricuspid. There is mild thickening of the aortic  valve. Aortic valve regurgitation is trivial. Mild aortic valve sclerosis  is present, with no evidence of aortic valve stenosis. __________  Carlton Adam MPI 11/17/2020:  There was no ST segment deviation noted during stress.  No T wave inversion was noted during stress.  This is an intermediate risk study due to reduced ejection fraction.  The left ventricular ejection fraction is moderately decreased visually (35%). measured EF was 20%. correlation with echo advised  There was no evidence for ischemia __________  Zio patch 12/2020:  The patient was monitored for 3 days, 2 hours.  The predominant rhythm was atrial fibrillation (100% burden) with an average ventricular rate of 83 bpm (range 48-146 bpm).  There were frequent PVC's versus aberrancy (8.5% burden).  No prolonged pauses were observed.  There were no patient triggered events.  Persistent atrial fibrillation (100% burden) with frequent PVC's and/or  aberrance.   EKG:  EKG is ordered today.  The EKG ordered today demonstrates A. fib, 65 bpm, left axis deviation, poor R wave progression along the precordial leads, possible prior inferior infarct, nonspecific ST-T changes, no significant changes when compared to prior tracing  Recent Labs: 11/16/2020: TSH 10.115 12/24/2020: Hemoglobin 14.6; Platelets 226 02/09/2021: B Natriuretic Peptide 262.5; Magnesium 1.9 03/11/2021: ALT 16; BUN 28; Creatinine, Ser 2.46; Potassium 4.4; Sodium 138  Recent Lipid Panel    Component Value Date/Time   CHOL 92 03/11/2021 0844   TRIG 209 (H) 03/11/2021 0844   HDL 28 (L) 03/11/2021 0844   CHOLHDL 3.3 03/11/2021 0844   VLDL 42 (H) 03/11/2021 0844   LDLCALC 22 03/11/2021 0844    PHYSICAL EXAM:    VS:  BP 110/70 (BP Location: Left Arm, Patient Position: Sitting, Cuff Size: Normal)   Pulse 65   Ht 5\' 11"  (1.803 m)   Wt 271 lb 8 oz (123.2 kg)   SpO2 98%   BMI 37.87 kg/m   BMI: Body mass index is 37.87 kg/m.  Physical Exam Vitals reviewed.  Constitutional:      Appearance: He is well-developed.  HENT:     Head: Normocephalic and atraumatic.  Eyes:     General:        Right eye: No discharge.        Left eye: No discharge.  Neck:     Vascular: No JVD.  Cardiovascular:     Rate and Rhythm: Normal rate. Rhythm irregularly irregular.     Pulses: No midsystolic click and no opening snap.          Posterior tibial pulses are 2+ on the right side and 2+ on the left side.     Heart sounds: Normal heart sounds, S1 normal and S2 normal. Heart sounds not distant. No murmur heard. No friction rub.  Pulmonary:     Effort:  Pulmonary effort is normal. No respiratory distress.     Breath sounds: Normal breath sounds. No decreased breath sounds, wheezing or rales.  Chest:     Chest wall: No tenderness.  Abdominal:     General: There is no distension.     Palpations: Abdomen is soft.     Tenderness: There is no abdominal tenderness.  Musculoskeletal:      Cervical back: Normal range of motion.  Skin:    General: Skin is warm and dry.     Nails: There is no clubbing.  Neurological:     Mental Status: He is alert and oriented to person, place, and time.  Psychiatric:        Speech: Speech normal.        Behavior: Behavior normal.        Thought Content: Thought content normal.        Judgment: Judgment normal.     Wt Readings from Last 3 Encounters:  03/11/21 271 lb 8 oz (123.2 kg)  02/09/21 284 lb (128.8 kg)  01/27/21 275 lb 6 oz (124.9 kg)     ASSESSMENT & PLAN:   1. CAD involving the native coronary arteries without angina: He is doing well and is without symptoms concerning for angina.  Recent Lexiscan MPI demonstrated no significant ischemia or scar.  Continue secondary prevention and current medical therapy including atorvastatin, Toprol-XL, and losartan.  He is on Eliquis in place of aspirin to minimize bleeding risk.  At this time, there is no current indication for repeat ischemic testing.  2. HFrEF: He appears euvolemic and well compensated with NYHA class II symptoms.  Continue current GDMT including Farxiga, furosemide, losartan, and metoprolol succinate.  Relative hypotension precludes transition of ARB to ARNI.  He is not on an MRA secondary to underlying CKD.  His weight is down 13 pounds today compared to his last clinic visit on 02/09/2021.  With improved PVC burden on mexiletine, and now that he is on maximally tolerated GDMT we will plan to pursue a repeat echo in 05/2021 to reevaluate his LV systolic function.  If his cardiomyopathy persists at that time we may need to revisit possible diagnostic R/LHC, if renal function allows.  He has been evaluated by EP and was not interested in pursuing ICD implantation.  In this setting, EP has recommended as needed follow-up.  CHF education.  3. Permanent A. Fib: Ventricular rates are well controlled.  Asymptomatic.  Given duration of his A. fib with rate control strategy being assumed  several years ago I suspect there would be low success for rhythm control strategy.  Last EKG demonstrating sinus rhythm dated 2012.  He prefers a conservative approach to his A. fib given he is asymptomatic with this and does not want to follow-up with the A. fib clinic in [place].  We will continue rate control with Toprol-XL and digoxin.  CHADS2VASc 5 (CHF, HTN, age x1, DM, vascular disease).  He remains on Eliquis without any symptoms concerning for bleeding and recent CBC showing stable hemoglobin.  Check CMP and digoxin level.  4. Frequent PVCs: Significantly improved PVC burden on repeat outpatient cardiac monitoring on mexiletine.  He is tolerating medical therapy without issues.  He remains on mexiletine and Toprol-XL.  5. Pulmonary hypertension: Likely in the setting of underlying OSA with obesity and possible OHS.  Continued use of CPAP is recommended.  6. CKD stage III: With his weight being down 13 pounds we will check his renal function.  Followed by nephrology.  7. HTN: Blood pressure is well controlled in the office today.  Continue current medical therapy as outlined above.  8. HLD: LDL 95 in 10/2020 after being off medications for approximately 4 weeks at that time.  Goal LDL less than 70.  Tolerating atorvastatin 80 mg.  Check CMP and lipid panel.  Escalate lipid therapy as indicated to target LDL less than 70.  9. OSA: CPAP.  10. DM: Last A1c improved to 8.0.  Followed by PCP.  Disposition: F/u with Dr. Saunders Revel or an APP after echo.    Medication Adjustments/Labs and Tests Ordered: Current medicines are reviewed at length with the patient today.  Concerns regarding medicines are outlined above. Medication changes, Labs and Tests ordered today are summarized above and listed in the Patient Instructions accessible in Encounters.   Signed, Christell Faith, PA-C 03/11/2021 10:50 AM     [address] [place], [place] [address] [phone]

## 2021-03-11 ENCOUNTER — Other Ambulatory Visit
Admission: RE | Admit: 2021-03-11 | Discharge: 2021-03-11 | Disposition: A | Discharge status: Home / Self Care | Payer: Commercial Managed Care - PPO | Attending: Physician Assistant | Admitting: Physician Assistant

## 2021-03-11 ENCOUNTER — Encounter: Payer: Self-pay | Admitting: Physician Assistant

## 2021-03-11 ENCOUNTER — Telehealth: Payer: Self-pay | Admitting: *Deleted

## 2021-03-11 ENCOUNTER — Ambulatory Visit (INDEPENDENT_AMBULATORY_CARE_PROVIDER_SITE_OTHER): Payer: Commercial Managed Care - PPO | Admitting: Physician Assistant

## 2021-03-11 ENCOUNTER — Other Ambulatory Visit: Payer: Self-pay

## 2021-03-11 VITALS — BP 110/70 | HR 65 | Ht 71.0 in | Wt 271.5 lb

## 2021-03-11 DIAGNOSIS — I4821 Permanent atrial fibrillation: Secondary | ICD-10-CM

## 2021-03-11 DIAGNOSIS — I493 Ventricular premature depolarization: Secondary | ICD-10-CM

## 2021-03-11 DIAGNOSIS — N183 Chronic kidney disease, stage 3 unspecified: Secondary | ICD-10-CM

## 2021-03-11 DIAGNOSIS — I5020 Unspecified systolic (congestive) heart failure: Secondary | ICD-10-CM

## 2021-03-11 DIAGNOSIS — I10 Essential (primary) hypertension: Secondary | ICD-10-CM

## 2021-03-11 DIAGNOSIS — I2510 Atherosclerotic heart disease of native coronary artery without angina pectoris: Secondary | ICD-10-CM | POA: Diagnosis not present

## 2021-03-11 DIAGNOSIS — G4733 Obstructive sleep apnea (adult) (pediatric): Secondary | ICD-10-CM

## 2021-03-11 DIAGNOSIS — E785 Hyperlipidemia, unspecified: Secondary | ICD-10-CM

## 2021-03-11 DIAGNOSIS — I272 Pulmonary hypertension, unspecified: Secondary | ICD-10-CM

## 2021-03-11 LAB — COMPREHENSIVE METABOLIC PANEL
ALT: 16 U/L (ref 0–44)
AST: 21 U/L (ref 15–41)
Albumin: 4.2 g/dL (ref 3.5–5.0)
Alkaline Phosphatase: 91 U/L (ref 38–126)
Anion gap: 10 (ref 5–15)
BUN: 28 mg/dL — ABNORMAL HIGH (ref 8–23)
CO2: 23 mmol/L (ref 22–32)
Calcium: 8.9 mg/dL (ref 8.9–10.3)
Chloride: 105 mmol/L (ref 98–111)
Creatinine, Ser: 2.46 mg/dL — ABNORMAL HIGH (ref 0.61–1.24)
GFR, Estimated: 28 mL/min — ABNORMAL LOW (ref >60)
Glucose, Bld: 120 mg/dL — ABNORMAL HIGH (ref 70–99)
Potassium: 4.4 mmol/L (ref 3.5–5.1)
Sodium: 138 mmol/L (ref 135–145)
Total Bilirubin: 1.2 mg/dL (ref 0.3–1.2)
Total Protein: 7.4 g/dL (ref 6.5–8.1)

## 2021-03-11 LAB — LIPID PANEL
Cholesterol: 92 mg/dL (ref 0–200)
HDL: 28 mg/dL — ABNORMAL LOW (ref >40)
LDL Cholesterol: 22 mg/dL (ref 0–99)
Total CHOL/HDL Ratio: 3.3 RATIO
Triglycerides: 209 mg/dL — ABNORMAL HIGH (ref <150)
VLDL: 42 mg/dL — ABNORMAL HIGH (ref 0–40)

## 2021-03-11 LAB — DIGOXIN LEVEL: Digoxin Level: 0.7 ng/mL — ABNORMAL LOW (ref 0.8–2.0)

## 2021-03-11 MED ORDER — FUROSEMIDE 20 MG PO TABS: ORAL_TABLET | ORAL | 3 refills | Status: DC

## 2021-03-11 MED ORDER — POTASSIUM CHLORIDE CRYS ER 20 MEQ PO TBCR
20.0000 meq | EXTENDED_RELEASE_TABLET | ORAL | 3 refills | Status: DC
Start: 2021-03-11 — End: 2021-10-11

## 2021-03-11 NOTE — Patient Instructions (Signed)
Medication Instructions:  No changes  *If you need a refill on your cardiac medications before your next appointment, please call your pharmacy*   Lab Work: Lipid, CMET, and Digoxin level today at the [facility] entrance.   If you have labs (blood work) drawn today and your tests are completely normal, you will receive your results only by: Marland Kitchen MyChart Message (if you have MyChart) OR . A paper copy in the mail If you have any lab test that is abnormal or we need to change your treatment, we will call you to review the results.   Testing/Procedures: Your physician has requested that you have an echocardiogram in July 2022.   Echocardiography is a painless test that uses sound waves to create images of your heart. It provides your doctor with information about the size and shape of your heart and how well your heart's chambers and valves are working. This procedure takes approximately one hour. There are no restrictions for this procedure.     Follow-Up: At [facility], you and your health needs are our priority.  As part of our continuing mission to provide you with exceptional heart care, we have created designated Provider Care Teams.  These Care Teams include your primary Cardiologist (physician) and Advanced Practice Providers (APPs -  Physician Assistants and Nurse Practitioners) who all work together to provide you with the care you need, when you need it.   Your next appointment:   Follow up after Echocardiogram  The format for your next appointment:   In Person  Provider:   Nelva Bush, MD or Christell Faith, PA-C

## 2021-03-11 NOTE — Telephone Encounter (Signed)
-----   Message from Rise Mu, PA-C sent at 03/11/2021  9:55 AM EDT ----- Digoxin level is okay Potassium at goal Fasting glucose mildly elevated with known diabetes Renal function is mildly elevated when compared to prior check LDL is much improved and at goal Liver function is normal  Recommendations: -With slightly uptrending renal function and 13 pound weight loss when compared to his last visit, recommend he change furosemide from 40 mg every other day to 40 mg alternating with 20 mg while continuing to take loop diuretic every other day.  He should take KCl only on the days he takes 40 mg of Lasix -Follow-up with PCP as directed for ongoing diabetic care

## 2021-03-11 NOTE — Telephone Encounter (Signed)
Reviewed results and recommendations with patient. He verbalized understanding and will send him My Chart message with the changes as well. He did request that we not send in medication because he has so much of it now. He states that he will call or send My Chart message when he needs refill. He verbalized understanding of our conversation with no further questions at this time.

## 2021-03-16 ENCOUNTER — Other Ambulatory Visit: Payer: Commercial Managed Care - PPO

## 2021-03-21 ENCOUNTER — Encounter: Payer: Self-pay | Admitting: *Deleted

## 2021-03-21 NOTE — Telephone Encounter (Signed)
I was unable to reach the patient prior to his 03/11/21 appointment with Christell Faith, PA.  I have reviewed his chart and he did see Ryan on 03/11/21.  The patient is scheduled to have an echo on 06/07/21 and see Ryan back on 06/17/21.  Closing this encounter.

## 2021-03-29 ENCOUNTER — Other Ambulatory Visit: Payer: Commercial Managed Care - PPO

## 2021-03-29 ENCOUNTER — Other Ambulatory Visit: Payer: Self-pay

## 2021-03-29 DIAGNOSIS — C61 Malignant neoplasm of prostate: Secondary | ICD-10-CM

## 2021-03-30 ENCOUNTER — Telehealth: Payer: Self-pay | Admitting: *Deleted

## 2021-03-30 LAB — PSA: Prostate Specific Ag, Serum: 0.5 ng/mL (ref 0.0–4.0)

## 2021-03-30 NOTE — Telephone Encounter (Signed)
-----   Message from Nori Riis, PA-C sent at 03/30/2021  7:53 AM EDT ----- Please let Joshua Landry know that his PSA is 0.5 and to keep his appointment with Dr. Bernardo Heater in July

## 2021-03-30 NOTE — Telephone Encounter (Signed)
Notified patient as instructed, patient pleased. Discussed follow-up appointments, patient agrees  

## 2021-05-07 ENCOUNTER — Other Ambulatory Visit: Payer: Self-pay | Admitting: Internal Medicine

## 2021-05-09 ENCOUNTER — Other Ambulatory Visit: Payer: Self-pay | Admitting: *Deleted

## 2021-05-09 MED ORDER — FUROSEMIDE 20 MG PO TABS: ORAL_TABLET | ORAL | 0 refills | Status: DC

## 2021-05-09 NOTE — Telephone Encounter (Signed)
Correction: Tablets 330 dispensed.

## 2021-05-09 NOTE — Telephone Encounter (Signed)
Requested Prescriptions   Pending Prescriptions Disp Refills   furosemide (LASIX) 20 MG tablet [Pharmacy Med Name: FUROSEMIDE 20MG  TABLETS] 90 tablet     Sig: TAKE 2 TABLETS(40 MG) BY MOUTH EVERY OTHER DAY

## 2021-06-07 ENCOUNTER — Ambulatory Visit (INDEPENDENT_AMBULATORY_CARE_PROVIDER_SITE_OTHER): Payer: Commercial Managed Care - PPO

## 2021-06-07 ENCOUNTER — Other Ambulatory Visit: Payer: Self-pay

## 2021-06-07 DIAGNOSIS — I5020 Unspecified systolic (congestive) heart failure: Secondary | ICD-10-CM | POA: Diagnosis not present

## 2021-06-07 DIAGNOSIS — I2510 Atherosclerotic heart disease of native coronary artery without angina pectoris: Secondary | ICD-10-CM | POA: Diagnosis not present

## 2021-06-07 DIAGNOSIS — I4821 Permanent atrial fibrillation: Secondary | ICD-10-CM | POA: Diagnosis not present

## 2021-06-07 LAB — ECHOCARDIOGRAM COMPLETE
AR max vel: 3.57 cm2
AV Area VTI: 3.2 cm2
AV Area mean vel: 3.43 cm2
AV Mean grad: 1 mmHg
AV Peak grad: 2.2 mmHg
Ao pk vel: 0.74 m/s
S' Lateral: 3.4 cm

## 2021-06-07 MED ORDER — PERFLUTREN LIPID MICROSPHERE
1.0000 mL | INTRAVENOUS | Status: AC | PRN
  Administered 2021-06-07: 2 mL via INTRAVENOUS

## 2021-06-09 ENCOUNTER — Telehealth: Payer: Self-pay | Admitting: *Deleted

## 2021-06-09 ENCOUNTER — Other Ambulatory Visit: Payer: Self-pay | Admitting: Internal Medicine

## 2021-06-09 NOTE — Telephone Encounter (Signed)
Left voicemail message to call back for review of results.  

## 2021-06-09 NOTE — Telephone Encounter (Signed)
-----   Message from Rise Mu, PA-C sent at 06/08/2021 11:26 AM EDT ----- Echo showed a normal pump function, normal wall motion, mild thickening of the heart, and a trivially leaky aortic valve. When compared to prior echo, the pump function has improved and is now normal. The walls of the heart are also now moving normally. This is great news. Continue current medical therapy and follow up plan.

## 2021-06-10 NOTE — Telephone Encounter (Signed)
Left voicemail message to call back for review of results.  

## 2021-06-12 NOTE — Progress Notes (Signed)
06/13/2021 8:19 AM   Joshua Landry [birth year] [id number]  Referring provider: Rose Phi, [place] [address] [place],   [address]  Chief Complaint  Patient presents with   Prostate Cancer    Urologic history: 1. cT2 high risk prostate cancer -Diagnosed May 2018; PSA 25.5; biopsy Gleason 4+49/12 cores -Negative bone scan; prostate MRI 25 cc gland; 1.2 cm nonspecific right external iliac lymph node -Treated IMRT + ADT -IMRT completed 08/14/2017 -Leuprolide completed July 2019 -PSA nadir 11/2018 at 0.01  HPI: 65 y.o. male presents for 41-month follow-up prostate cancer  PSA at last visit was 0.8 which had increased from a prior level of 0.24 December 2019 Repeat PSA May 2022 was 0.5 At last visit also complaining of bothersome storage related voiding symptoms occurring after his radiation therapy Was given a trial of Myrbetriq which he states did decrease his urinary frequency and urgency.  He did not call for an Rx after completing the samples however is interested in starting Denies dysuria, gross hematuria No flank, abdominal or pelvic pain   PMH: Past Medical History:  Diagnosis Date   (HFpEF) heart failure with preserved ejection fraction (HCC)    CAD (coronary artery disease)    Chronic kidney disease (CKD), stage III (moderate) (HCC)    Diabetes mellitus without complication ([place])    Essential hypertension    Hyperlipidemia    Morbid obesity (HCC)    OSA (obstructive sleep apnea)    Permanent atrial fibrillation ([place])    Prostate CA [facility])     Surgical History: Past Surgical History:  Procedure Laterality Date   WRIST SURGERY Right     Home Medications:  Allergies as of 06/13/2021       Reactions   Prasugrel Rash   Rash  Rash  Rash         Medication List        Accurate as of June 13, 2021  8:19 AM. If you have any questions, ask your nurse or doctor.          STOP taking these medications    isosorbide mononitrate  30 MG 24 hr tablet Commonly known as: IMDUR Stopped by: Abbie Sons, MD       TAKE these medications    apixaban 5 MG Tabs tablet Commonly known as: ELIQUIS Take 1 tablet (5 mg total) by mouth 2 (two) times daily.   atorvastatin 80 MG tablet Commonly known as: LIPITOR Take 1 tablet (80 mg total) by mouth at bedtime.   dapagliflozin propanediol 10 MG Tabs tablet Commonly known as: Farxiga Take 1 tablet (10 mg total) by mouth daily before breakfast.   digoxin 0.125 MG tablet Commonly known as: LANOXIN TAKE 1 TABLET BY MOUTH ONCE DAILY   furosemide 20 MG tablet Commonly known as: LASIX Take 2 tablets (40 mg) by mouth alternating with 1 tablet (20 mg) once EVERY OTHER day What changed: Another medication with the same name was removed. Continue taking this medication, and follow the directions you see here. Changed by: Abbie Sons, MD   glimepiride 1 MG tablet Commonly known as: AMARYL Take 1 mg by mouth daily with breakfast.   levothyroxine 50 MCG tablet Commonly known as: SYNTHROID Take 1 tablet (50 mcg total) by mouth daily at 6 (six) AM.   losartan 25 MG tablet Commonly known as: COZAAR Take 0.5 tablets (12.5 mg total) by mouth daily.   metoprolol succinate 100 MG 24 hr tablet Commonly known as: TOPROL-XL  Take 1 tablet (100 mg total) by mouth 2 (two) times daily. Take with or immediately following a meal.   mexiletine 200 MG capsule Commonly known as: MEXITIL Take 1 capsule (200 mg total) by mouth 2 (two) times daily.   nitroGLYCERIN 0.4 MG SL tablet Commonly known as: NITROSTAT Place 1 tablet (0.4 mg total) under the tongue See admin instructions. Place 1 tablet under the tongue every 5 minutes as needed for chest pain. Max of 3 doses in 15 minutes.   Ozempic (1 MG/DOSE) 4 MG/3ML Sopn Generic drug: Semaglutide (1 MG/DOSE) Inject 1 mg into the skin once a week.   potassium chloride SA 20 MEQ tablet Commonly known as: KLOR-CON Take 1 tablet (20 mEq  total) by mouth every other day. Take on days when you take the 40 mg of Furosemide.   tamsulosin 0.4 MG Caps capsule Commonly known as: FLOMAX Take 1 capsule (0.4 mg total) by mouth daily.   Vitamin D3 10 MCG (400 UNIT) Caps Take 400 Units by mouth daily.        Allergies:  Allergies  Allergen Reactions   Prasugrel Rash    Rash  Rash  Rash      Family History: History reviewed. No pertinent family history.  Social History:  reports that he has never smoked. His smokeless tobacco use includes snuff. He reports current alcohol use. He reports that he does not use drugs.   Physical Exam: BP 136/83   Pulse 85   Ht 5\' 11"  (1.803 m)   Wt 260 lb (117.9 kg)   BMI 36.26 kg/m   Constitutional:  Alert and oriented, No acute distress. HEENT: [place] AT, moist mucus membranes.  Trachea midline, no masses. Cardiovascular: No clubbing, cyanosis, or edema. Respiratory: Normal respiratory effort, no increased work of breathing.    Assessment & Plan:    1.  Prostate cancer T2 high risk prostate cancer status post IMRT + ADT Slowly rising PSA but decreased from January 2022 level of 0.8 Lab visit 6 months for PSA Office visit 1 year   2.  Lower urinary tract symptoms Rx Myrbetriq 50 mg sent to [facility], [place] [address] [place], [place] [address] [phone]

## 2021-06-13 ENCOUNTER — Ambulatory Visit: Payer: Commercial Managed Care - PPO | Admitting: Urology

## 2021-06-13 ENCOUNTER — Encounter: Payer: Self-pay | Admitting: Urology

## 2021-06-13 ENCOUNTER — Other Ambulatory Visit: Payer: Self-pay

## 2021-06-13 ENCOUNTER — Ambulatory Visit (INDEPENDENT_AMBULATORY_CARE_PROVIDER_SITE_OTHER): Payer: Commercial Managed Care - PPO | Admitting: Urology

## 2021-06-13 VITALS — BP 136/83 | HR 85 | Ht 71.0 in | Wt 260.0 lb

## 2021-06-13 DIAGNOSIS — C61 Malignant neoplasm of prostate: Secondary | ICD-10-CM

## 2021-06-13 DIAGNOSIS — R399 Unspecified symptoms and signs involving the genitourinary system: Secondary | ICD-10-CM | POA: Diagnosis not present

## 2021-06-13 MED ORDER — MIRABEGRON ER 50 MG PO TB24: 50.0000 mg | ORAL_TABLET | Freq: Every day | ORAL | 11 refills | Status: DC

## 2021-06-13 NOTE — Telephone Encounter (Signed)
Reviewed results and recommendations with patient. Confirmed upcoming appointment and he had no further questions at this time.

## 2021-06-13 NOTE — Telephone Encounter (Signed)
Left voicemail message to call back for review of results.  

## 2021-06-14 NOTE — Progress Notes (Signed)
Cardiology Office Note    Date:  06/17/2021   ID:  NIKOLOS BILLIG, DOB 1955-12-04, MRN [id number]  PCP:  Rose Phi, [place]  Cardiologist:  Nelva Bush, MD  Electrophysiologist:  Virl Axe, MD   Chief Complaint: Follow-up echo  History of Present Illness:   RANDI COLLEGE is a 65 y.o. male with history of CAD status post PCI x2 to the RCA in 2011, permanent A. fib, HFrEF with subsequent normalization of LVSF by echo in 05/2021, pulmonary hypertension, CKD stage III, DM2, prostate cancer, HTN, HLD, morbid obesity, and OSA not on CPAP who presents for hospital follow-up of his CAD, Afib, and HFrEF.   He was admitted to the hospital in 01/2010 with an NSTEMI and underwent PCI/BMS to the proximal RCA.  He underwent repeat cath in 05/2010 and was found to have significant ISR within the RCA stent and underwent PCI/DES at that time.  He was admitted in 11/2010 with another NSTEMI with subsequent cath demonstrating patent RCA stent with a possible thrombus in the PL branch and otherwise nonobstructive disease.  Echo in 12/2014 showed an EF of 20 to 25%, mildly dilated LV cavity size, mild concentric LVH, mildly dilated left atrium, mild mitral regurgitation, small right-sided pleural effusion, and a dilated IVC.  When compared to prior echo there had been a significant decline in his EF.  In this setting, he underwent cath 12/2014 that showed severe small diagonal branch mid stenosis with mild ISR of the RCA stent with medical management advised.  Echo from 2018 showed an EF of 55 to 60%, mildly increased LV septal wall thickness with concentric LVH, mildly dilated RV, mild to moderate right atrium dilatation, mildly dilated left atrium measuring 45 mm, mild dilatation of the ascending aorta measuring 3.6 cm.  Review of EKG reports in care everywhere showed his last documented time in sinus rhythm was 2012.  He has indicated his former cardiologist had previously discussed rhythm control  strategy though due to his multiple comorbid conditions rate control strategy was decided upon.     He had been lost to follow-up from his primary cardiology group in [place], [place] since 07/2019, until his admission to [facility] in 10/2020, in the setting of recently changed jobs and inability to afford insurance during this transition.  In this setting, he ran out of his medications for approximately 4 weeks prior to his presentation to [facility] on 11/16/2020 with increased shortness of breath and bilateral lower extremity swelling.  Upon his arrival he was noted to be volume overloaded and in A. fib with RVR.  Initial high-sensitivity troponin 83 with a delta of 65.  BNP 244.  Symptoms improved with IV diuresis and rate control.  Echo showed an EF of 35 to 40%, global hypokinesis, indeterminate LV diastolic function parameters, mildly reduced RV systolic function with normal RV cavity size, moderately dilated left atrium, mildly to moderately dilated right atrium, trivial mitral regurgitation, trivial aortic insufficiency, mild aortic valve sclerosis without evidence of stenosis.  LHC was deferred, at his request, given his comorbid conditions including CKD as he indicated he would not want to be on dialysis.  He underwent Lexiscan MPI to evaluate for high risk ischemia, which showed no evidence of ischemia and was an intermediate risk study due to reduced LV systolic function.  Prior to discharge, medications were optimized as tolerated.  He was seen in hospital follow up on 11/26/2020 and was doing well from a cardiac perspective. 50 mg of Toprol  XL was added in the PM, to further optimize his regimen, and his Lasix was decreased to every other day given acute on CKD.    He was seen in 12/2020, for routine follow up, at which time he was doing well from a cardiac perspective. His weight was stable at 270-271 pounds. Given frequent PVCs noted on 12-lead EKG, his Toprol was titrated to 100 mg bid, and he underwent a  3-day Zio monitor that showed 100% Afub burden with an average ventricular rate of 83 bpm (range 48-146 bpm), frequent PVCs vs aberrancy with an 8.5% burden, and no prolonged pauses.   He was evaluated by EP on 01/27/2021 for evaluation of PVCs in the setting of his underlying cardiomyopathy with recommendation to add digoxin for added rate control of his Afib and management of his cardiomyopathy. With regards to his PVC burden, in the context of his cardiomyopathy, he was placed on mexiletine 200 mg bid with recommendation to repeat Zio patch in follow up. He was felt to be volume up, leading his Lasix to be increased to 40 mg every other day.  With regards to ICD, he indicated he is not interested in resuscitation.    He was seen on 02/09/2021, at which time he was doing well from a cardiac perspective.  His weight was up 9 pounds, though he did not feel like he was volume up.  He was started on Farxiga.  Otherwise, relative hypotension precluded further escalation of GDMT.  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  He was last seen on 03/11/2021 and was doing well from a cardiac perspective.  His weight was down 13 pounds when compared to his visit in 01/2021.  He preferred a less aggressive approach to his medical care and declined A. fib clinic evaluation as well as ICD.  With regards to his cardiomyopathy, he underwent echo in 05/2021 to evaluate LVSF on maximally tolerated GDMT which demonstrated normalization of LVSF with an EF of 55%, no regional wall motion abnormalities, mild LVH, indeterminate LV diastolic function parameters, normal RV systolic function and ventricular cavity size, moderately dilated left atrium, and trivial aortic insufficiency.   He comes in doing very well from a cardiac  perspective.  No chest pain, dyspnea, palpitations, dizziness, presyncope, syncope, or falls.  No hematochezia, melena, hemoptysis, hematemesis, or hematuria.  His weight is down 4 pounds when compared to his last clinic visit in 02/2021.  He is tolerating all medications without issues.  No lower extremity swelling, abdominal distention, or worsening orthopnea.  He continues to watch his fluid and salt intake.  He does not have any active cardiac issues or concerns at this time.   Labs independently reviewed: 05/2021 - A1c 7.6 02/2021 - TC 92, TG 209, HDL 28, LDL 22, potassium 4.4, BUN 28, serum creatinine 2.46, albumin 4.2, AST/ALT normal, digoxin 0.7, TSH normal 01/2021 - magnesium 1.9 12/2020 - Hgb 14.6, PLT 226  Past Medical History:  Diagnosis Date   (HFpEF) heart failure with preserved ejection fraction (HCC)    CAD (coronary artery disease)    Chronic kidney disease (CKD), stage III (moderate) (HCC)    Diabetes mellitus without complication ([place])    Essential hypertension    Hyperlipidemia    Morbid obesity (HCC)    OSA (obstructive sleep apnea)  Permanent atrial fibrillation (HCC)    Prostate [facility])     Past Surgical History:  Procedure Laterality Date   WRIST SURGERY Right     Current Medications: Current Meds  Medication Sig   Semaglutide, 2 MG/DOSE, (OZEMPIC, 2 MG/DOSE,) 8 MG/3ML SOPN Inject into the skin.    Allergies:   Prasugrel   Social History   Socioeconomic History   Marital status: Soil scientist    Spouse name: Not on file   Number of children: Not on file   Years of education: Not on file   Highest education level: Not on file  Occupational History   Not on file  Tobacco Use   Smoking status: Never   Smokeless tobacco: Current    Types: Snuff  Vaping Use   Vaping Use: Never used  Substance and Sexual Activity   Alcohol use: Yes    Comment: occassional   Drug use: Never   Sexual activity: Not Currently  Other Topics Concern   Not on file   Social History Narrative   Not on file   Social Determinants of Health   Financial Resource Strain: Not on file  Food Insecurity: Not on file  Transportation Needs: Not on file  Physical Activity: Not on file  Stress: Not on file  Social Connections: Not on file     Family History:  The patient's family history is not on file.  ROS:   Review of Systems  Constitutional:  Negative for chills, diaphoresis, fever, malaise/fatigue and weight loss.  HENT:  Negative for congestion.   Eyes:  Negative for discharge and redness.  Respiratory:  Negative for cough, hemoptysis, sputum production, shortness of breath and wheezing.   Cardiovascular:  Negative for chest pain, palpitations, orthopnea, claudication, leg swelling and PND.  Gastrointestinal:  Negative for abdominal pain, blood in stool, heartburn, melena, nausea and vomiting.  Genitourinary:  Negative for hematuria.  Musculoskeletal:  Positive for neck pain. Negative for falls and myalgias.  Skin:  Negative for rash.  Neurological:  Negative for dizziness, tingling, tremors, sensory change, speech change, focal weakness, loss of consciousness and weakness.  Endo/Heme/Allergies:  Does not bruise/bleed easily.  Psychiatric/Behavioral:  Negative for substance abuse. The patient is not nervous/anxious.   All other systems reviewed and are negative.   EKGs/Labs/Other Studies Reviewed:    Studies reviewed were summarized above. The additional studies were reviewed today:  2D echo 12/21/2014 [facility]): Summary:  1. Left ventricle septal thickness is mildly increased. --with mild  concentric LVH  2. The left ventricular chamber size is mildly dilated.  3. Severe global hypokinesis of the left ventricle is observed.  4. The EF is estimated at 20-25%.  5. The left atrium is mildly dilated.  6. There is mild mitral regurgitation observed.  7. There is a small pleural effusion. --right sided  8. The inferior vena cava appears dilated  suggesting elevated central  venous pressure. --mild increased  9. Since prior outside echo, there is significant decline in LVEF  10. Incidential gallstone noted __________   LHC 12/22/2014 [facility]): PRESSURES:  RA Pressure:  31 mean  mmHG  RV Pressure:  62 mmHG     RVEDP:   32  mmHG  PA Pressure:  64/48 mmHG     Mean PA pressure:  53  mmHG  PCWP:  Mean 44  mmHG no V wave  AO pressure:  125/101  mmHG     Mean AO pressure:   458  mmHG  LV systolic  pressure:  22  mmHG     LVEDP:  40  mmHG   OXYGEN SATURATIONS:  RA:  49  %  PA:   49 %  Femoral:   94 %  Other:  N/A   CARDIAC OUTPUT:  Thermodilution cardiac output:   4.4  L/min  Thermodilution cardiac index:   1.9 L/min/m2  Fick cardiac output:   3.2 L/min  Fick cardiac index:   1.3  L/min/m2    CORONARY ANATOMY:  Left Main:  This vessel bifurcated into LAD, small ramus branch, and  moderately sized left circumflex vessel.  The left main artery had no  stenosis   Left anterior descending artery:  This vessel gave rise to a long 2 mm  small-to medium-sized diagonal branch.  The diagonal branch had a 99% mid  stenosis that was not the culprit of the patient's profound  cardiomyopathy.  The proximal LAD was patent.  The mid LAD had a  moderately long 30-50% stenosis.   Left circumflex artery:  This vessel and its large mid obtuse marginal  branch and medium-sized distal obtuse marginal branch was free of disease.   Right coronary artery:  This vessel gave rise to a long proximal vessel  stent with mild in-stent restenosis.  The remainder of the RCA and its  medium-sized PDA and small-to medium-sized posterior lateral branch were  free of disease.    LEFT VENTRICULOGRAM:  This was performed by hand injection to spare dye.    There is underfilling of the ventricle showing severe LV chamber  enlargement with severe global hypokinesis and left ventricular ejection  fraction less than 20%.  There was inability to use assess for  mitral  regurgitation or aortic size.   CONCLUSIONS:  1.  Severe small vessel disease of a single branch otherwise mild LAD  disease and very mild in-stent restenosis of the right coronary artery;  the cardiomyopathy is out of proportion to severity of coronary disease  2.  Severe LV systolic dysfunction  3.  Profound volume overload by right heart catheterization with marginal  cardiac index    PLAN:    1.  IV Lasix diuresis  2.  Continue to advance carvedilol and ACE inhibitor therapy  ___________   2D echo 10/23/2017 [facility]): Summary:  1. Left ventricle septal thickness is mildly increased. --with  concentric LVH  2. The EF is estimated at 55-60%.  3. Abnormal diastolic filling pattern consistent with underlying atrial  fibrillation was observed. --with heart rate 66 to 94 bpm  4. The right ventricular cavity size is mildly enlarged.  5. The right atrium is mild to moderately dilated.    6. The inferior vena cava is not visualized.  7. There is mild dilatation of the ascending aorta. --at 3.6 cm   Recommendation:  1. Consider non compilance of OSA therapy contributing as an etiology  for recent fluid retention symptoms __________   2D echo 11/16/2020:  1. Left ventricular ejection fraction, by estimation, is 35 to 40%. The  left ventricle has moderately decreased function. The left ventricle  demonstrates global hypokinesis. There is mild left ventricular  hypertrophy. Left ventricular diastolic  parameters are indeterminate.   2. Right ventricular systolic function is mildly reduced. The right  ventricular size is normal. Tricuspid regurgitation signal is inadequate  for assessing PA pressure.   3. Left atrial size was moderately dilated.   4. Right atrial size was mild to moderately dilated.   5. The mitral valve is  normal in structure. Trivial mitral valve  regurgitation. No evidence of mitral stenosis.   6. The aortic valve is tricuspid. There is mild thickening  of the aortic  valve. Aortic valve regurgitation is trivial. Mild aortic valve sclerosis  is present, with no evidence of aortic valve stenosis. __________   [facility] Adam MPI 11/17/2020: There was no ST segment deviation noted during stress. No T wave inversion was noted during stress. This is an intermediate risk study due to reduced ejection fraction. The left ventricular ejection fraction is moderately decreased visually (35%). measured EF was 20%. correlation with echo advised There was no evidence for ischemia __________   Zio patch 01/2021: The patient was monitored for 3 days, 2 hours. The predominant rhythm was atrial fibrillation (100% burden) with an average ventricular rate of 83 bpm (range 48-146 bpm). There were frequent PVC's versus aberrancy (8.5% burden). No prolonged pauses were observed. There were no patient triggered events.   Persistent atrial fibrillation (100% burden) with frequent PVC's and/or aberrance. __________  2D echo 06/07/2021: 1. Left ventricular ejection fraction, by estimation, is 55%. The left  ventricle has normal function. The left ventricle has no regional wall  motion abnormalities. There is mild left ventricular hypertrophy. Left  ventricular diastolic parameters are  indeterminate.   2. Right ventricular systolic function is normal. The right ventricular  size is normal.   3. Left atrial size was mild to moderately dilated.   4. The mitral valve is normal in structure. No evidence of mitral valve  regurgitation.   5. The aortic valve is tricuspid. Aortic valve regurgitation is trivial.   EKG:  EKG is ordered today.  The EKG ordered today demonstrates A. fib, 77 bpm, left axis deviation, poor R wave progression along the preprecordial leads, prior inferior infarct, no acute ST-T changes  Recent Labs: 11/16/2020: TSH 10.115 12/24/2020: Hemoglobin 14.6; Platelets 226 02/09/2021: B Natriuretic Peptide 262.5; Magnesium 1.9 03/11/2021: ALT 16; BUN  28; Creatinine, Ser 2.46; Potassium 4.4; Sodium 138  Recent Lipid Panel    Component Value Date/Time   CHOL 92 03/11/2021 0844   TRIG 209 (H) 03/11/2021 0844   HDL 28 (L) 03/11/2021 0844   CHOLHDL 3.3 03/11/2021 0844   VLDL 42 (H) 03/11/2021 0844   LDLCALC 22 03/11/2021 0844    PHYSICAL EXAM:    VS:  BP 118/68 (BP Location: Left Arm, Patient Position: Sitting, Cuff Size: Normal)   Pulse 77   Wt 267 lb (121.1 kg)   SpO2 97%   BMI 37.24 kg/m   BMI: Body mass index is 37.24 kg/m.  Physical Exam Vitals reviewed.  Constitutional:      Appearance: He is well-developed.  HENT:     Head: Normocephalic and atraumatic.  Eyes:     General:        Right eye: No discharge.        Left eye: No discharge.  Neck:     Vascular: No JVD.  Cardiovascular:     Rate and Rhythm: Normal rate. Rhythm irregularly irregular.     Pulses:          Posterior tibial pulses are 2+ on the right side and 2+ on the left side.     Heart sounds: Normal heart sounds, S1 normal and S2 normal. Heart sounds not distant. No midsystolic click and no opening snap. No murmur heard.   No friction rub.  Pulmonary:     Effort: Pulmonary effort is normal. No respiratory distress.  Breath sounds: Normal breath sounds. No decreased breath sounds, wheezing or rales.  Chest:     Chest wall: No tenderness.  Abdominal:     General: There is no distension.     Palpations: Abdomen is soft.     Tenderness: There is no abdominal tenderness.  Musculoskeletal:     Cervical back: Normal range of motion.     Right lower leg: No edema.     Left lower leg: No edema.  Skin:    General: Skin is warm and dry.     Nails: There is no clubbing.  Neurological:     Mental Status: He is alert and oriented to person, place, and time.  Psychiatric:        Speech: Speech normal.        Behavior: Behavior normal.        Thought Content: Thought content normal.        Judgment: Judgment normal.    Wt Readings from Last 3  Encounters:  06/17/21 267 lb (121.1 kg)  06/13/21 260 lb (117.9 kg)  03/11/21 271 lb 8 oz (123.2 kg)     ASSESSMENT & PLAN:   CAD involving the native coronary arteries without angina: He comes in doing well and is without symptoms concerning for angina.  Recent Lexiscan MPI in 10/2020 demonstrated no significant ischemia or scar.  He has subsequently had normalization of LV systolic function with rate control and optimization of GDMT as outlined below.  Continue secondary prevention and current medical therapy including Eliquis in place of aspirin, atorvastatin, Toprol-XL, and losartan.  No indication for further ischemic testing at this time.  HFrEF: He appears euvolemic and well compensated.  His weight is down 4 pounds today when compared to his last visit in 02/2021.  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  Repeat limited echo on maximally tolerated GDMT, and mexiletine to reduce PVC burden, demonstrated normalization of LV systolic function.  We will discontinue digoxin given normalization of LV systolic function in the context of underlying CKD.  He will otherwise continue current GDMT including Toprol-XL, losartan, Farxiga, and furosemide.  Relative hypotension has precluded transition from ARB to ARNI.  Not on MRA secondary to underlying CKD.  CHF education.  Permanent A. fib: Ventricular rates are well controlled.  Given CKD we will discontinue digoxin.  He will be continued on Toprol-XL.  He will monitor for elevated ventricular rates following the discontinuation of digoxin.  We will obtain a digoxin level today along with a BMP and CBC.  Given a CHA2DS2-VASc of at least 5 (CHF, HTN, age x1, DM, vascular disease) he remains on Eliquis without any symptoms concerning for bleeding.  Frequent PVCs: Significantly improved PVC burden on repeat outpatient cardiac monitoring.  He remains on mexiletine per EP and Toprol-XL.  Check  magnesium.  Pulmonary hypertension: Likely in the setting of underlying OSA with obesity.  Continue CPAP as outlined below.  CKD stage III: Most recent serum creatinine 2.46 which does appear to be above his baseline.  He is followed by nephrology.  Check BMP.  HTN: Blood pressure is well controlled in the office today.  Continue current medical therapy as outlined above.  HLD: LDL 22 in 02/2021 with normal LFT at that time.  He remains on atorvastatin 80 mg.  OSA: Continued CPAP adherence recommended.  DM: Last A1c improved to 7.6.  Disposition: F/u with Dr. Saunders Revel or an APP in 6  months, and EP as directed.   Medication Adjustments/Labs and Tests Ordered: Current medicines are reviewed at length with the patient today.  Concerns regarding medicines are outlined above. Medication changes, Labs and Tests ordered today are summarized above and listed in the Patient Instructions accessible in Encounters.   Signed, Christell Faith, PA-C 06/17/2021 1:01 PM     [address] [place], [place] [address] [phone]

## 2021-06-17 ENCOUNTER — Other Ambulatory Visit: Payer: Self-pay

## 2021-06-17 ENCOUNTER — Encounter: Payer: Self-pay | Admitting: Physician Assistant

## 2021-06-17 ENCOUNTER — Ambulatory Visit: Payer: Commercial Managed Care - PPO | Admitting: Physician Assistant

## 2021-06-17 VITALS — BP 118/68 | HR 77 | Wt 267.0 lb

## 2021-06-17 DIAGNOSIS — N183 Chronic kidney disease, stage 3 unspecified: Secondary | ICD-10-CM

## 2021-06-17 DIAGNOSIS — I272 Pulmonary hypertension, unspecified: Secondary | ICD-10-CM

## 2021-06-17 DIAGNOSIS — I2510 Atherosclerotic heart disease of native coronary artery without angina pectoris: Secondary | ICD-10-CM

## 2021-06-17 DIAGNOSIS — I493 Ventricular premature depolarization: Secondary | ICD-10-CM

## 2021-06-17 DIAGNOSIS — I5020 Unspecified systolic (congestive) heart failure: Secondary | ICD-10-CM | POA: Diagnosis not present

## 2021-06-17 DIAGNOSIS — I4821 Permanent atrial fibrillation: Secondary | ICD-10-CM | POA: Diagnosis not present

## 2021-06-17 DIAGNOSIS — I10 Essential (primary) hypertension: Secondary | ICD-10-CM

## 2021-06-17 DIAGNOSIS — G4733 Obstructive sleep apnea (adult) (pediatric): Secondary | ICD-10-CM

## 2021-06-17 DIAGNOSIS — E118 Type 2 diabetes mellitus with unspecified complications: Secondary | ICD-10-CM

## 2021-06-17 NOTE — Patient Instructions (Signed)
Medication Instructions:  Your physician has recommended you make the following change in your medication:   STOP Digoxin   *If you need a refill on your cardiac medications before your next appointment, please call your pharmacy*   Lab Work: BMET, CBC, Dig level, and Mag today  If you have labs (blood work) drawn today and your tests are completely normal, you will receive your results only by: [place] (if you have MyChart) OR A paper copy in the mail If you have any lab test that is abnormal or we need to change your treatment, we will call you to review the results.   Testing/Procedures: None   Follow-Up: At [facility], you and your health needs are our priority.  As part of our continuing mission to provide you with exceptional heart care, we have created designated Provider Care Teams.  These Care Teams include your primary Cardiologist (physician) and Advanced Practice Providers (APPs -  Physician Assistants and Nurse Practitioners) who all work together to provide you with the care you need, when you need it.   Your next appointment:   6 month(s)  The format for your next appointment:   In Person  Provider:   Nelva Bush, MD or Christell Faith, PA-C

## 2021-06-18 LAB — CBC
Hematocrit: 48.1 % (ref 37.5–51.0)
Hemoglobin: 15.7 g/dL (ref 13.0–17.7)
MCH: 29.2 pg (ref 26.6–33.0)
MCHC: 32.6 g/dL (ref 31.5–35.7)
MCV: 89 fL (ref 79–97)
Platelets: 245 10*3/uL (ref 150–450)
RBC: 5.38 x10E6/uL (ref 4.14–5.80)
RDW: 15.1 % (ref 11.6–15.4)
WBC: 9.7 10*3/uL (ref 3.4–10.8)

## 2021-06-18 LAB — BASIC METABOLIC PANEL
BUN/Creatinine Ratio: 12 (ref 10–24)
BUN: 25 mg/dL (ref 8–27)
CO2: 22 mmol/L (ref 20–29)
Calcium: 9 mg/dL (ref 8.6–10.2)
Chloride: 101 mmol/L (ref 96–106)
Creatinine, Ser: 2.1 mg/dL — ABNORMAL HIGH (ref 0.76–1.27)
Glucose: 147 mg/dL — ABNORMAL HIGH (ref 65–99)
Potassium: 4.6 mmol/L (ref 3.5–5.2)
Sodium: 140 mmol/L (ref 134–144)
eGFR: 34 mL/min/{1.73_m2} — ABNORMAL LOW (ref >59)

## 2021-06-18 LAB — MAGNESIUM: Magnesium: 2.4 mg/dL — ABNORMAL HIGH (ref 1.6–2.3)

## 2021-06-18 LAB — DIGOXIN LEVEL: Digoxin, Serum: 0.7 ng/mL (ref 0.5–0.9)

## 2021-06-20 ENCOUNTER — Telehealth: Payer: Self-pay | Admitting: *Deleted

## 2021-06-20 NOTE — Telephone Encounter (Signed)
Left voicemail message to call back for review of results.  

## 2021-06-20 NOTE — Addendum Note (Signed)
Addended by: Ronaldo Miyamoto on: 06/20/2021 01:34 PM   Modules accepted: Orders

## 2021-06-20 NOTE — Telephone Encounter (Signed)
-----   Message from Rise Mu, PA-C sent at 06/20/2021  9:02 AM EDT ----- Random glucose is mildly elevated with known diabetes Renal function remains elevated though is improved from prior Potassium at goal Blood count normal Digoxin level normal  Recommendations: -Continue to follow-up with PCP for ongoing diabetic care along with nephrology for underlying renal dysfunction -No changes from a cardiac plan discussed at his recent office visit

## 2021-06-20 NOTE — Telephone Encounter (Signed)
Patient returning call for results.    Patient works night and will not answer today .  He will call back tomorrow morning.

## 2021-06-20 NOTE — Telephone Encounter (Signed)
Left voicemail message for patient to call back for review of results  

## 2021-06-21 NOTE — Telephone Encounter (Signed)
The patient has been notified of the result and verbalized understanding.  All questions (if any) were answered.     

## 2021-06-27 ENCOUNTER — Other Ambulatory Visit: Payer: Self-pay | Admitting: Urology

## 2021-08-13 ENCOUNTER — Other Ambulatory Visit: Payer: Self-pay | Admitting: Internal Medicine

## 2021-08-13 ENCOUNTER — Other Ambulatory Visit: Payer: Self-pay | Admitting: Physician Assistant

## 2021-09-16 ENCOUNTER — Other Ambulatory Visit: Payer: Self-pay | Admitting: *Deleted

## 2021-09-16 MED ORDER — ATORVASTATIN CALCIUM 80 MG PO TABS: 80.0000 mg | ORAL_TABLET | Freq: Every evening | ORAL | 2 refills | Status: DC

## 2021-10-04 ENCOUNTER — Other Ambulatory Visit: Payer: Self-pay | Admitting: Physician Assistant

## 2021-10-11 ENCOUNTER — Other Ambulatory Visit: Payer: Self-pay | Admitting: Physician Assistant

## 2021-10-11 NOTE — Telephone Encounter (Signed)
Pt taking Potassium 20 meq qod.

## 2021-10-11 NOTE — Telephone Encounter (Signed)
LMOVM to verify potassium dose and instructions.

## 2021-10-31 ENCOUNTER — Encounter: Payer: Self-pay | Admitting: Urology

## 2021-11-09 ENCOUNTER — Other Ambulatory Visit: Payer: Self-pay | Admitting: Physician Assistant

## 2021-11-09 DIAGNOSIS — I5020 Unspecified systolic (congestive) heart failure: Secondary | ICD-10-CM

## 2021-11-09 MED ORDER — FUROSEMIDE 20 MG PO TABS: ORAL_TABLET | ORAL | 0 refills | Status: DC

## 2021-11-09 NOTE — Telephone Encounter (Signed)
Venkat from [employer] called to verify that pt is supposed to take his lasix 40 mg alternating with 20 mg every other day. Verified that that is correct. [place] asked for refill to be sent.   Refill sent to [facility].

## 2021-11-11 ENCOUNTER — Other Ambulatory Visit: Payer: Self-pay | Admitting: Physician Assistant

## 2021-11-25 ENCOUNTER — Other Ambulatory Visit: Payer: Self-pay | Admitting: Internal Medicine

## 2021-11-25 NOTE — Telephone Encounter (Signed)
This is a [facility] pt 

## 2021-12-15 ENCOUNTER — Other Ambulatory Visit: Payer: Self-pay | Admitting: Physician Assistant

## 2021-12-15 NOTE — Telephone Encounter (Signed)
Please review

## 2021-12-15 NOTE — Telephone Encounter (Signed)
Prescription refill request for Eliquis received. Indication: Atrial fib Last office visit: 06/17/21  R Dunn PA-C Scr: 2.10 on 06/17/21 Age: 66 Weight: 121.1kg  Based on above findings Eliquis 5mg  twice daily is the appropriate dose.   Requested BMP/CBC at upcoming appt with R Dunn on 12/27/21.  Refill approved.

## 2021-12-16 ENCOUNTER — Other Ambulatory Visit: Payer: Self-pay

## 2021-12-16 ENCOUNTER — Other Ambulatory Visit: Payer: Commercial Managed Care - PPO

## 2021-12-16 DIAGNOSIS — C61 Malignant neoplasm of prostate: Secondary | ICD-10-CM

## 2021-12-17 LAB — PSA: Prostate Specific Ag, Serum: 0.8 ng/mL (ref 0.0–4.0)

## 2021-12-19 ENCOUNTER — Ambulatory Visit: Payer: Commercial Managed Care - PPO | Admitting: Urology

## 2021-12-21 ENCOUNTER — Other Ambulatory Visit: Payer: Self-pay

## 2021-12-21 ENCOUNTER — Encounter: Payer: Self-pay | Admitting: Urology

## 2021-12-21 ENCOUNTER — Ambulatory Visit: Payer: Commercial Managed Care - PPO | Admitting: Urology

## 2021-12-21 VITALS — BP 103/65 | HR 86 | Ht 71.0 in | Wt 270.0 lb

## 2021-12-21 DIAGNOSIS — N3281 Overactive bladder: Secondary | ICD-10-CM | POA: Diagnosis not present

## 2021-12-21 DIAGNOSIS — C61 Malignant neoplasm of prostate: Secondary | ICD-10-CM | POA: Diagnosis not present

## 2021-12-21 DIAGNOSIS — N3941 Urge incontinence: Secondary | ICD-10-CM

## 2021-12-21 MED ORDER — GEMTESA 75 MG PO TABS: 75.0000 mg | ORAL_TABLET | Freq: Every day | ORAL | 0 refills | Status: AC

## 2021-12-21 NOTE — Progress Notes (Signed)
12/21/2021 8:04 AM   Joshua Landry [birth year] [id number]  Referring provider: Rose Phi, [place] [address] [place],  [place] [address]  Chief Complaint  Patient presents with   Elevated PSA    Urologic history: 1. cT2 high risk prostate cancer -Diagnosed May 2018; PSA 25.5; biopsy Gleason 4+49/12 cores -Negative bone scan; prostate MRI 25 cc gland; 1.2 cm nonspecific right external iliac lymph node -Treated IMRT + ADT -IMRT completed 08/14/2017 -Leuprolide completed July 2019 -PSA nadir 11/2018 at 0.01   2.  Overactive bladder with urge incontinence -Bothersome storage related voiding symptoms postradiation  HPI: 66 y.o. male presents for 16-month follow-up visit.  No significant changes since last visit Myrbetriq not as effective as when he first started Remains on tamsulosin Denies dysuria, gross hematuria Denies flank, abdominal or pelvic pain PSA 12/16/2021 stable at 0.8 Followed by nephrology for chronic kidney disease and recently progressed to stage IV   PMH: Past Medical History:  Diagnosis Date   (HFpEF) heart failure with preserved ejection fraction (HCC)    CAD (coronary artery disease)    Chronic kidney disease (CKD), stage III (moderate) (HCC)    Diabetes mellitus without complication ([place])    Essential hypertension    Hyperlipidemia    Morbid obesity (HCC)    OSA (obstructive sleep apnea)    Permanent atrial fibrillation ([place])    Prostate CA ([place])     Surgical History: Past Surgical History:  Procedure Laterality Date   WRIST SURGERY Right     Home Medications:  Allergies as of 12/21/2021       Reactions   Prasugrel Rash   Rash  Rash  Rash         Medication List        Accurate as of December 21, 2021  8:04 AM. If you have any questions, ask your nurse or doctor.          atorvastatin 80 MG tablet Commonly known as: LIPITOR Take 1 tablet (80 mg total) by mouth at bedtime.   Eliquis 5 MG Tabs  tablet Generic drug: apixaban TAKE 1 TABLET BY MOUTH TWICE  A DAY   Farxiga 10 MG Tabs tablet Generic drug: dapagliflozin propanediol TAKE 1 TABLET BY MOUTH ONCE DAILY BEFORE BREAKFAST   furosemide 20 MG tablet Commonly known as: LASIX Take 2 tablets (40 mg) by mouth alternating with 1 tablet (20 mg) once EVERY OTHER day   glimepiride 1 MG tablet Commonly known as: AMARYL Take 1 mg by mouth daily with breakfast.   levothyroxine 50 MCG tablet Commonly known as: SYNTHROID Take 1 tablet (50 mcg total) by mouth daily at 6 (six) AM.   losartan 25 MG tablet Commonly known as: COZAAR TAKE 1/2 TABLET BY MOUTH ONCE DAILY   metoprolol succinate 100 MG 24 hr tablet Commonly known as: TOPROL-XL TAKE 1 TABLET BY MOUTH TWICE  A DAY WITH OR IMMEDIATELY FOLLOWING A MEAL   mexiletine 200 MG capsule Commonly known as: MEXITIL TAKE 1 CAPSULE BY MOUTH TWICE  A DAY   mirabegron ER 50 MG Tb24 tablet Commonly known as: MYRBETRIQ Take 1 tablet (50 mg total) by mouth daily.   nitroGLYCERIN 0.4 MG SL tablet Commonly known as: NITROSTAT PUT 1 TABLET UNDER TONGUE AS NEEDED CHEST PAIN. MAY REPEAT EVERY 5 MINUTES UP TO TOTAL 3 TABLETS   Ozempic (2 MG/DOSE) 8 MG/3ML Sopn Generic drug: Semaglutide (2 MG/DOSE) Inject into the skin.   potassium chloride SA 20 MEQ tablet Commonly  known as: KLOR-CON M TAKE 1 TABLET BY MOUTH EVERY OTHER DAY   tamsulosin 0.4 MG Caps capsule Commonly known as: FLOMAX TAKE 1 CAPSULE BY MOUTH ONCE DAILY   Vitamin D3 10 MCG (400 UNIT) Caps Take 400 Units by mouth daily.        Allergies:  Allergies  Allergen Reactions   Prasugrel Rash    Rash  Rash  Rash      Family History: No family history on file.  Social History:  reports that he has never smoked. His smokeless tobacco use includes snuff. He reports current alcohol use. He reports that he does not use drugs.   Physical Exam: BP 103/65    Pulse 86    Ht 5\' 11"  (1.803 m)    Wt 270 lb (122.5 kg)     BMI 37.66 kg/m   Constitutional:  Alert and oriented, No acute distress. HEENT: [place] AT, moist mucus membranes.  Trachea midline, no masses. Cardiovascular: No clubbing, cyanosis, or edema. Respiratory: Normal respiratory effort, no increased work of breathing. Psychiatric: Normal mood and affect.   Assessment & Plan:    1.  High risk prostate cancer Stable PSA Lab visit 6 months for PSA in 1 year follow-up  2.  Overactive bladder with urge incontinence Myrbetriq no longer effective Second line options were discussed however he does not desire to pursue He was interested in a trial of Gemtesa and samples given   Abbie Sons, MD  [facility] [address], [place], [place] [address] [phone]

## 2021-12-23 ENCOUNTER — Ambulatory Visit: Payer: Commercial Managed Care - PPO | Admitting: Internal Medicine

## 2021-12-26 NOTE — Progress Notes (Signed)
Cardiology Office Note    Date:  12/27/2021   ID:  Joshua Landry, DOB [birth year], MRN [id number]  PCP:  Rose Phi, [place]  Cardiologist:  Nelva Bush, MD  Electrophysiologist:  Virl Axe, MD   Chief Complaint: Follow up  History of Present Illness:   Joshua Landry is a 66 y.o. male with history of CAD status post PCI x2 to the RCA in 2011, permanent A. fib, HFrEF with subsequent normalization of LVSF by echo in 05/2021, pulmonary hypertension, CKD stage IV, DM2, prostate cancer, HTN, HLD, morbid obesity, and OSA not on CPAP who presents for hospital follow-up of his CAD, Afib, and HFrEF.   He was admitted to the hospital in 01/2010 with an NSTEMI and underwent PCI/BMS to the proximal RCA.  He underwent repeat cath in 05/2010 and was found to have significant ISR within the RCA stent and underwent PCI/DES at that time.  He was admitted in 11/2010 with another NSTEMI with subsequent cath demonstrating patent RCA stent with a possible thrombus in the PL branch and otherwise nonobstructive disease.  Echo in 12/2014 showed an EF of 20 to 25%, mildly dilated LV cavity size, mild concentric LVH, mildly dilated left atrium, mild mitral regurgitation, small right-sided pleural effusion, and a dilated IVC.  When compared to prior echo there had been a significant decline in his EF.  In this setting, he underwent cath 12/2014 that showed severe small diagonal branch mid stenosis with mild ISR of the RCA stent with medical management advised.  Echo from 2018 showed an EF of 55 to 60%, mildly increased LV septal wall thickness with concentric LVH, mildly dilated RV, mild to moderate right atrium dilatation, mildly dilated left atrium measuring 45 mm, mild dilatation of the ascending aorta measuring 3.6 cm.  Review of EKG reports in care everywhere showed his last documented time in sinus rhythm was 2012.  He has indicated his former cardiologist had previously discussed rhythm control strategy  though due to his multiple comorbid conditions rate control strategy was decided upon.     He had been lost to follow-up from his primary cardiology group in [place], [place] since 07/2019, until his admission to [facility] in 10/2020, in the setting of recently changed jobs and inability to afford insurance during this transition.  In this setting, he ran out of his medications for approximately 4 weeks prior to his presentation to [facility] on 11/16/2020 with increased shortness of breath and bilateral lower extremity swelling.  Upon his arrival he was noted to be volume overloaded and in A. fib with RVR.  Initial high-sensitivity troponin 83 with a delta of 65.  BNP 244.  Symptoms improved with IV diuresis and rate control.  Echo showed an EF of 35 to 40%, global hypokinesis, indeterminate LV diastolic function parameters, mildly reduced RV systolic function with normal RV cavity size, moderately dilated left atrium, mildly to moderately dilated right atrium, trivial mitral regurgitation, trivial aortic insufficiency, mild aortic valve sclerosis without evidence of stenosis.  LHC was deferred, at his request, given his comorbid conditions including CKD as he indicated he would not want to be on dialysis.  He underwent Lexiscan MPI to evaluate for high risk ischemia, which showed no evidence of ischemia and was an intermediate risk study due to reduced LV systolic function.  Prior to discharge, medications were optimized as tolerated.  He was seen in hospital follow up on 11/26/2020 and was doing well from a cardiac perspective. 50 mg of Toprol  XL was added in the PM, to further optimize his regimen, and his Lasix was decreased to every other day given acute on CKD.    He was seen in 12/2020, for routine follow up, at which time he was doing well from a cardiac perspective. His weight was stable at 270-271 pounds. Given frequent PVCs noted on 12-lead EKG, his Toprol was titrated to 100 mg bid, and he underwent a 3-day  Zio monitor that showed 100% Afub burden with an average ventricular rate of 83 bpm (range 48-146 bpm), frequent PVCs vs aberrancy with an 8.5% burden, and no prolonged pauses.   He was evaluated by EP on 01/27/2021 for evaluation of PVCs in the setting of his underlying cardiomyopathy with recommendation to add digoxin for added rate control of his Afib and management of his cardiomyopathy. With regards to his PVC burden, in the context of his cardiomyopathy, he was placed on mexiletine 200 mg bid with recommendation to repeat Zio patch in follow up. He was felt to be volume up, leading his Lasix to be increased to 40 mg every other day.  With regards to ICD, he indicated he is not interested in resuscitation.    He was seen on 02/09/2021, at which time he was doing well from a cardiac perspective.  His weight was up 9 pounds, though he did not feel like he was volume up.  He was started on Farxiga.  Otherwise, relative hypotension precluded further escalation of GDMT.  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   He was seen on 03/11/2021 and was doing well from a cardiac perspective.  His weight was down 13 pounds when compared to his visit in 01/2021.  He preferred a less aggressive approach to his medical care and declined A. fib clinic evaluation as well as ICD.  With regards to his cardiomyopathy, he underwent echo in 05/2021 to evaluate LVSF on maximally tolerated GDMT which demonstrated normalization of LVSF with an EF of 55%, no regional wall motion abnormalities, mild LVH, indeterminate LV diastolic function parameters, normal RV systolic function and ventricular cavity size, moderately dilated left atrium, and trivial aortic insufficiency.  He was last seen in 05/2021 and was doing very well from  a cardiac perspective, without symptoms of angina or decompensation.  He comes in doing very well from a cardiac perspective and is without symptoms of angina or decompensation.  No lower extremity swelling, abdominal distention, PND, orthopnea, or early satiety.  No falls, hematochezia, or melena.  His weight remains largely stable at home.  He is adherent and tolerating all medications.  He continues to watch his fluid and salt intake.  His renal function has declined and is being monitored by nephrology.  He does indicate he would not want to go on dialysis if his renal function got to that point.  He also indicates he is uncertain if he would want to pursue renal transplant.  He does not have any active cardiac issues or concerns at this time.   Labs independently reviewed: 05/2021 - magnesium 2.4, digoxin 0.7, Hgb 15.7, PLT 245, BUN 25, SCr 2.10 05/2021 - A1c 7.6 02/2021 - TC 92, TG 209, HDL 28, LDL 22, albumin 4.2, AST/ALT normal, TSH normal   Past Medical History:  Diagnosis Date   (HFpEF) heart failure  with preserved ejection fraction (HCC)    CAD (coronary artery disease)    Chronic kidney disease (CKD), stage III (moderate) (HCC)    Diabetes mellitus without complication ([place])    Essential hypertension    Hyperlipidemia    Morbid obesity (HCC)    OSA (obstructive sleep apnea)    Permanent atrial fibrillation (HCC)    Prostate CA ([place])     Past Surgical History:  Procedure Laterality Date   WRIST SURGERY Right     Current Medications: Current Meds  Medication Sig   atorvastatin (LIPITOR) 80 MG tablet Take 1 tablet (80 mg total) by mouth at bedtime.   Cholecalciferol (VITAMIN D3) 10 MCG (400 UNIT) CAPS Take 400 Units by mouth daily.   ELIQUIS 5 MG TABS tablet TAKE 1 TABLET BY MOUTH TWICE  A DAY   FARXIGA 10 MG TABS tablet TAKE 1 TABLET BY MOUTH ONCE DAILY BEFORE BREAKFAST   furosemide (LASIX) 20 MG tablet Take 2 tablets (40 mg) by mouth alternating with 1 tablet (20 mg) once  EVERY OTHER day   glimepiride (AMARYL) 1 MG tablet Take 1 mg by mouth daily with breakfast.   levothyroxine (SYNTHROID) 50 MCG tablet Take 1 tablet (50 mcg total) by mouth daily at 6 (six) AM.   losartan (COZAAR) 25 MG tablet TAKE 1/2 TABLET BY MOUTH ONCE DAILY   metoprolol succinate (TOPROL-XL) 100 MG 24 hr tablet TAKE 1 TABLET BY MOUTH TWICE  A DAY WITH OR IMMEDIATELY FOLLOWING A MEAL   mexiletine (MEXITIL) 200 MG capsule TAKE 1 CAPSULE BY MOUTH TWICE  A DAY   nitroGLYCERIN (NITROSTAT) 0.4 MG SL tablet PUT 1 TABLET UNDER TONGUE AS NEEDED CHEST PAIN. MAY REPEAT EVERY 5 MINUTES UP TO TOTAL 3 TABLETS   potassium chloride SA (KLOR-CON) 20 MEQ tablet TAKE 1 TABLET BY MOUTH EVERY OTHER DAY   Semaglutide, 2 MG/DOSE, (OZEMPIC, 2 MG/DOSE,) 8 MG/3ML SOPN Inject into the skin once a week.   tamsulosin (FLOMAX) 0.4 MG CAPS capsule TAKE 1 CAPSULE BY MOUTH ONCE DAILY   Vibegron (GEMTESA) 75 MG TABS Take 75 mg by mouth daily.    Allergies:   Prasugrel   Social History   Socioeconomic History   Marital status: Soil scientist    Spouse name: Not on file   Number of children: Not on file   Years of education: Not on file   Highest education level: Not on file  Occupational History   Not on file  Tobacco Use   Smoking status: Never   Smokeless tobacco: Current    Types: Snuff  Vaping Use   Vaping Use: Never used  Substance and Sexual Activity   Alcohol use: Yes    Comment: occassional   Drug use: Never   Sexual activity: Not Currently  Other Topics Concern   Not on file  Social History Narrative   Not on file   Social Determinants of Health   Financial Resource Strain: Not on file  Food Insecurity: Not on file  Transportation Needs: Not on file  Physical Activity: Not on file  Stress: Not on file  Social Connections: Not on file     Family History:  The patient's family history is not on file.  ROS:   Review of Systems  Constitutional:  Negative for chills, diaphoresis,  fever, malaise/fatigue and weight loss.  HENT:  Negative for congestion.   Eyes:  Negative for discharge and redness.  Respiratory:  Negative for cough, sputum production, shortness of breath and  wheezing.   Cardiovascular:  Negative for chest pain, palpitations, orthopnea, claudication, leg swelling and PND.  Gastrointestinal:  Negative for abdominal pain, blood in stool, heartburn, melena, nausea and vomiting.  Musculoskeletal:  Negative for falls and myalgias.  Skin:  Negative for rash.  Neurological:  Negative for dizziness, tingling, tremors, sensory change, speech change, focal weakness, loss of consciousness and weakness.  Endo/Heme/Allergies:  Does not bruise/bleed easily.  Psychiatric/Behavioral:  Negative for substance abuse. The patient is not nervous/anxious.   All other systems reviewed and are negative.   EKGs/Labs/Other Studies Reviewed:    Studies reviewed were summarized above. The additional studies were reviewed today:  2D echo 12/21/2014 [facility]): Summary:  1. Left ventricle septal thickness is mildly increased. --with mild  concentric LVH  2. The left ventricular chamber size is mildly dilated.  3. Severe global hypokinesis of the left ventricle is observed.  4. The EF is estimated at 20-25%.  5. The left atrium is mildly dilated.  6. There is mild mitral regurgitation observed.  7. There is a small pleural effusion. --right sided  8. The inferior vena cava appears dilated suggesting elevated central  venous pressure. --mild increased  9. Since prior outside echo, there is significant decline in LVEF  10. Incidential gallstone noted __________   [facility] 12/22/2014 [facility]): PRESSURES:  RA Pressure:  31 mean  mmHG  RV Pressure:  62 mmHG     RVEDP:   32  mmHG  PA Pressure:  64/48 mmHG     Mean PA pressure:  53  mmHG  PCWP:  Mean 44  mmHG no V wave  AO pressure:  125/101  mmHG     Mean AO pressure:   956  mmHG  LV systolic pressure:  22  mmHG     LVEDP:  40  mmHG    OXYGEN SATURATIONS:  RA:  49  %  PA:   49 %  Femoral:   94 %  Other:  N/A   CARDIAC OUTPUT:  Thermodilution cardiac output:   4.4  L/min  Thermodilution cardiac index:   1.9 L/min/m2  Fick cardiac output:   3.2 L/min  Fick cardiac index:   1.3  L/min/m2    CORONARY ANATOMY:  Left Main:  This vessel bifurcated into LAD, small ramus branch, and  moderately sized left circumflex vessel.  The left main artery had no  stenosis   Left anterior descending artery:  This vessel gave rise to a long 2 mm  small-to medium-sized diagonal branch.  The diagonal branch had a 99% mid  stenosis that was not the culprit of the patient's profound  cardiomyopathy.  The proximal LAD was patent.  The mid LAD had a  moderately long 30-50% stenosis.   Left circumflex artery:  This vessel and its large mid obtuse marginal  branch and medium-sized distal obtuse marginal branch was free of disease.   Right coronary artery:  This vessel gave rise to a long proximal vessel  stent with mild in-stent restenosis.  The remainder of the RCA and its  medium-sized PDA and small-to medium-sized posterior lateral branch were  free of disease.    LEFT VENTRICULOGRAM:  This was performed by hand injection to spare dye.    There is underfilling of the ventricle showing severe LV chamber  enlargement with severe global hypokinesis and left ventricular ejection  fraction less than 20%.  There was inability to use assess for mitral  regurgitation or aortic size.   CONCLUSIONS:  1.  Severe small vessel disease of a single branch otherwise mild LAD  disease and very mild in-stent restenosis of the right coronary artery;  the cardiomyopathy is out of proportion to severity of coronary disease  2.  Severe LV systolic dysfunction  3.  Profound volume overload by right heart catheterization with marginal  cardiac index    PLAN:    1.  IV Lasix diuresis  2.  Continue to advance carvedilol and ACE inhibitor therapy   ___________   2D echo 10/23/2017 [facility]): Summary:  1. Left ventricle septal thickness is mildly increased. --with  concentric LVH  2. The EF is estimated at 55-60%.  3. Abnormal diastolic filling pattern consistent with underlying atrial  fibrillation was observed. --with heart rate 66 to 94 bpm  4. The right ventricular cavity size is mildly enlarged.  5. The right atrium is mild to moderately dilated.    6. The inferior vena cava is not visualized.  7. There is mild dilatation of the ascending aorta. --at 3.6 cm   Recommendation:  1. Consider non compilance of OSA therapy contributing as an etiology  for recent fluid retention symptoms __________   2D echo 11/16/2020:  1. Left ventricular ejection fraction, by estimation, is 35 to 40%. The  left ventricle has moderately decreased function. The left ventricle  demonstrates global hypokinesis. There is mild left ventricular  hypertrophy. Left ventricular diastolic  parameters are indeterminate.   2. Right ventricular systolic function is mildly reduced. The right  ventricular size is normal. Tricuspid regurgitation signal is inadequate  for assessing PA pressure.   3. Left atrial size was moderately dilated.   4. Right atrial size was mild to moderately dilated.   5. The mitral valve is normal in structure. Trivial mitral valve  regurgitation. No evidence of mitral stenosis.   6. The aortic valve is tricuspid. There is mild thickening of the aortic  valve. Aortic valve regurgitation is trivial. Mild aortic valve sclerosis  is present, with no evidence of aortic valve stenosis. __________   [facility] MPI 11/17/2020: There was no ST segment deviation noted during stress. No T wave inversion was noted during stress. This is an intermediate risk study due to reduced ejection fraction. The left ventricular ejection fraction is moderately decreased visually (35%). measured EF was 20%. correlation with echo advised There was no  evidence for ischemia __________   Zio patch 01/2021: The patient was monitored for 3 days, 2 hours. The predominant rhythm was atrial fibrillation (100% burden) with an average ventricular rate of 83 bpm (range 48-146 bpm). There were frequent PVC's versus aberrancy (8.5% burden). No prolonged pauses were observed. There were no patient triggered events.   Persistent atrial fibrillation (100% burden) with frequent PVC's and/or aberrance. __________   2D echo 06/07/2021: 1. Left ventricular ejection fraction, by estimation, is 55%. The left  ventricle has normal function. The left ventricle has no regional wall  motion abnormalities. There is mild left ventricular hypertrophy. Left  ventricular diastolic parameters are  indeterminate.   2. Right ventricular systolic function is normal. The right ventricular  size is normal.   3. Left atrial size was mild to moderately dilated.   4. The mitral valve is normal in structure. No evidence of mitral valve  regurgitation.   5. The aortic valve is tricuspid. Aortic valve regurgitation is trivial.   EKG:  EKG is ordered today.  The EKG ordered today demonstrates A-fib, 84 bpm, left axis deviation, prior inferior infarct, nonspecific  lateral ST-T changes, and poor R wave progression along the precordial leads, no significant change when compared to prior tracings  Recent Labs: 02/09/2021: B Natriuretic Peptide 262.5 03/11/2021: ALT 16 06/17/2021: BUN 25; Creatinine, Ser 2.10; Hemoglobin 15.7; Magnesium 2.4; Platelets 245; Potassium 4.6; Sodium 140  Recent Lipid Panel    Component Value Date/Time   CHOL 92 03/11/2021 0844   TRIG 209 (H) 03/11/2021 0844   HDL 28 (L) 03/11/2021 0844   CHOLHDL 3.3 03/11/2021 0844   VLDL 42 (H) 03/11/2021 0844   LDLCALC 22 03/11/2021 0844    PHYSICAL EXAM:    VS:  BP 100/72 (BP Location: Left Arm, Patient Position: Sitting, Cuff Size: Large)    Pulse 84    Ht 5\' 11"  (1.803 m)    Wt 273 lb (123.8 kg)    SpO2  98%    BMI 38.08 kg/m   BMI: Body mass index is 38.08 kg/m.  Physical Exam Constitutional:      Appearance: He is well-developed.  HENT:     Head: Normocephalic and atraumatic.  Eyes:     General:        Right eye: No discharge.        Left eye: No discharge.  Neck:     Vascular: No JVD.  Cardiovascular:     Rate and Rhythm: Normal rate. Rhythm irregularly irregular.     Pulses:          Posterior tibial pulses are 2+ on the right side and 2+ on the left side.     Heart sounds: Normal heart sounds, S1 normal and S2 normal. Heart sounds not distant. No midsystolic click and no opening snap. No murmur heard.   No friction rub.  Pulmonary:     Effort: Pulmonary effort is normal. No respiratory distress.     Breath sounds: Normal breath sounds. No decreased breath sounds, wheezing or rales.  Chest:     Chest wall: No tenderness.  Abdominal:     General: There is no distension.     Palpations: Abdomen is soft.     Tenderness: There is no abdominal tenderness.  Musculoskeletal:     Cervical back: Normal range of motion.     Right lower leg: No edema.     Left lower leg: No edema.  Skin:    General: Skin is warm and dry.     Nails: There is no clubbing.  Neurological:     Mental Status: He is alert and oriented to person, place, and time.  Psychiatric:        Speech: Speech normal.        Behavior: Behavior normal.        Thought Content: Thought content normal.        Judgment: Judgment normal.    Wt Readings from Last 3 Encounters:  12/27/21 273 lb (123.8 kg)  12/21/21 270 lb (122.5 kg)  06/17/21 267 lb (121.1 kg)     ASSESSMENT & PLAN:   CAD involving the native coronary arteries without angina: He is doing well and is without symptoms concerning for angina.  Lexiscan MPI in 10/2020 demonstrated no significant ischemia or scar with subsequent normalization of LV systolic function with rate control and optimization of GDMT.  Continue secondary prevention and current  medical therapy including apixaban in place of aspirin to minimize bleeding risk, atorvastatin, Toprol-XL, and losartan.  No indication for further ischemic testing at this time.  HFrEF: He appears euvolemic and well compensated with  a stable weight.  His cardiomyopathy is felt to be likely mixed ischemic and nonischemic in etiology.  Repeat limited echo on maximally tolerated GDMT, and mexiletine to reduce PVC burden, demonstrated normalization of LV systolic function.  He is no longer on digoxin, as this was discontinued in 05/2021, given normalization of LV systolic function and in the context of underlying renal dysfunction.  Otherwise, he will continue current GDMT including Toprol-XL, losartan, Farxiga, and furosemide.  Relative hypotension has precluded transition from ARB to ARNI.  He is not on MRA secondary to underlying CKD.  If his renal function continues to decline we may need to consider discontinuation of SGLT2 inhibitor.  CHF education.  Check CMP.  No current indication for device therapy.  He has also indicated in the past he prefers a more conservative approach and states he would not want to "artificially" prolong his life.  Permanent Afib: Ventricular rates remain well controlled.  He remains on metoprolol XL for rate control.  Given a CHA2DS2-VASc of at least 5 he remains on apixaban without symptoms concerning for bleeding.  Check CBC.  He does not meet reduced dosing criteria for [place].  Frequent PVCs: Quiescent on mexiletine, as directed by EP, and Toprol-XL.  Pulmonary hypertension: Likely in the setting of underlying OSA with obesity.  Continue CPAP as outlined below.  He remains on low-dose furosemide.  CKD stage IV: Followed by nephrology.  He does indicate he would not want to proceed with dialysis if his renal function continued to decline.  He also notes he is uncertain if he would want to move forward with renal transplant.  Check labs today.  Follow-up with nephrology as  directed.  HTN: Blood pressure is well controlled in the office today.  Continue current medical therapy as outlined above.  HLD: LDL 22 in 02/2021 with normal LFT at that time.  He remains on atorvastatin 80 mg.   Disposition: F/u with Dr. Saunders Revel or an APP in 6 months, and EP as directed.   Medication Adjustments/Labs and Tests Ordered: Current medicines are reviewed at length with the patient today.  Concerns regarding medicines are outlined above. Medication changes, Labs and Tests ordered today are summarized above and listed in the Patient Instructions accessible in Encounters.   Signed, Christell Faith, PA-C 12/27/2021 10:28 AM     [place] [address] [place] [place], [place] [address] [phone]

## 2021-12-27 ENCOUNTER — Encounter: Payer: Self-pay | Admitting: Physician Assistant

## 2021-12-27 ENCOUNTER — Ambulatory Visit: Payer: Commercial Managed Care - PPO | Admitting: Physician Assistant

## 2021-12-27 ENCOUNTER — Other Ambulatory Visit: Payer: Self-pay

## 2021-12-27 VITALS — BP 100/72 | HR 84 | Ht 71.0 in | Wt 273.0 lb

## 2021-12-27 DIAGNOSIS — E785 Hyperlipidemia, unspecified: Secondary | ICD-10-CM

## 2021-12-27 DIAGNOSIS — I272 Pulmonary hypertension, unspecified: Secondary | ICD-10-CM

## 2021-12-27 DIAGNOSIS — I4821 Permanent atrial fibrillation: Secondary | ICD-10-CM

## 2021-12-27 DIAGNOSIS — I5020 Unspecified systolic (congestive) heart failure: Secondary | ICD-10-CM | POA: Diagnosis not present

## 2021-12-27 DIAGNOSIS — I493 Ventricular premature depolarization: Secondary | ICD-10-CM

## 2021-12-27 DIAGNOSIS — I10 Essential (primary) hypertension: Secondary | ICD-10-CM

## 2021-12-27 DIAGNOSIS — I2510 Atherosclerotic heart disease of native coronary artery without angina pectoris: Secondary | ICD-10-CM | POA: Diagnosis not present

## 2021-12-27 DIAGNOSIS — N184 Chronic kidney disease, stage 4 (severe): Secondary | ICD-10-CM | POA: Diagnosis not present

## 2021-12-27 NOTE — Patient Instructions (Signed)
Medication Instructions:  No changes at this time.  *If you need a refill on your cardiac medications before your next appointment, please call your pharmacy*   Lab Work: CMET, CBC, and Lipid panel  If you have labs (blood work) drawn today and your tests are completely normal, you will receive your results only by: [place] (if you have MyChart) OR A paper copy in the mail If you have any lab test that is abnormal or we need to change your treatment, we will call you to review the results.   Testing/Procedures: None   Follow-Up: At [facility], you and your health needs are our priority.  As part of our continuing mission to provide you with exceptional heart care, we have created designated Provider Care Teams.  These Care Teams include your primary Cardiologist (physician) and Advanced Practice Providers (APPs -  Physician Assistants and Nurse Practitioners) who all work together to provide you with the care you need, when you need it.   Your next appointment:   6 month(s)  The format for your next appointment:   In Person  Provider:   Nelva Bush, MD or Christell Faith, PA-C

## 2021-12-28 ENCOUNTER — Telehealth: Payer: Self-pay | Admitting: Physician Assistant

## 2021-12-28 DIAGNOSIS — E875 Hyperkalemia: Secondary | ICD-10-CM

## 2021-12-28 LAB — COMPREHENSIVE METABOLIC PANEL
ALT: 38 IU/L (ref 0–44)
AST: 32 IU/L (ref 0–40)
Albumin/Globulin Ratio: 2 (ref 1.2–2.2)
Albumin: 4.7 g/dL (ref 3.8–4.8)
Alkaline Phosphatase: 142 IU/L — ABNORMAL HIGH (ref 44–121)
BUN/Creatinine Ratio: 11 (ref 10–24)
BUN: 27 mg/dL (ref 8–27)
Bilirubin Total: 0.8 mg/dL (ref 0.0–1.2)
CO2: 22 mmol/L (ref 20–29)
Calcium: 9.1 mg/dL (ref 8.6–10.2)
Chloride: 108 mmol/L — ABNORMAL HIGH (ref 96–106)
Creatinine, Ser: 2.57 mg/dL — ABNORMAL HIGH (ref 0.76–1.27)
Globulin, Total: 2.4 g/dL (ref 1.5–4.5)
Glucose: 125 mg/dL — ABNORMAL HIGH (ref 70–99)
Potassium: 5.5 mmol/L — ABNORMAL HIGH (ref 3.5–5.2)
Sodium: 145 mmol/L — ABNORMAL HIGH (ref 134–144)
Total Protein: 7.1 g/dL (ref 6.0–8.5)
eGFR: 27 mL/min/{1.73_m2} — ABNORMAL LOW (ref >59)

## 2021-12-28 LAB — CBC
Hematocrit: 52.9 % — ABNORMAL HIGH (ref 37.5–51.0)
Hemoglobin: 17.6 g/dL (ref 13.0–17.7)
MCH: 29.4 pg (ref 26.6–33.0)
MCHC: 33.3 g/dL (ref 31.5–35.7)
MCV: 89 fL (ref 79–97)
Platelets: 217 10*3/uL (ref 150–450)
RBC: 5.98 x10E6/uL — ABNORMAL HIGH (ref 4.14–5.80)
RDW: 15.8 % — ABNORMAL HIGH (ref 11.6–15.4)
WBC: 9.7 10*3/uL (ref 3.4–10.8)

## 2021-12-28 LAB — LIPID PANEL
Chol/HDL Ratio: 3.7 ratio (ref 0.0–5.0)
Cholesterol, Total: 104 mg/dL (ref 100–199)
HDL: 28 mg/dL — ABNORMAL LOW (ref >39)
LDL Chol Calc (NIH): 49 mg/dL (ref 0–99)
Triglycerides: 159 mg/dL — ABNORMAL HIGH (ref 0–149)
VLDL Cholesterol Cal: 27 mg/dL (ref 5–40)

## 2021-12-28 NOTE — Telephone Encounter (Signed)
1st attempt to contact the patient by phone. No answer- I left a message to please call back.

## 2021-12-28 NOTE — Telephone Encounter (Signed)
Nelva Bush, MD  12/28/2021  3:02 PM EST     Labs notable for slight worsening of creatinine from baseline.  Mild hyperkalemia also noted.  I recommend stopping losartan and potassium chloride at this time and repeating a BMP in 1 week.  Triglycerides mildly elevated but LDL at goal.  Continue current dose of atorvastatin.

## 2021-12-29 NOTE — Telephone Encounter (Signed)
Pt returned call.  Notified lab results and Dr. Darnelle Bos recc below.  Pt voiced understanding. Pt will:  - Stop taking Losartan and Potassium at this time - Repeat BMET in 1 week - Continue current dose of atorvastatin  Pt can only have labs done in the morning. Our office lab schedule not open for morning next Thursday or Friday.  Pt agrees to have lab at hospital next Thursday 2/16.  Pt understands process to have [facility].  Lab orders in place.  Pt has no further questions.

## 2021-12-29 NOTE — Telephone Encounter (Signed)
Attempted to call pt. No answer. Lmtcb.  Also called pt's s/o Christine (DPR approved) - LDM that pt needs to call office today to adjust medications d/t elevated potassium.

## 2022-01-03 IMAGING — US US RENAL
1 series · 14 of 25 positions shown · non-contrast
Comparison: None.

CLINICAL DATA: Chronic kidney disease.

EXAM:
RENAL / URINARY TRACT ULTRASOUND COMPLETE

[Series 1: us renal · 0.26mm/px · 14 of 38 slices shown]
[im 1/38]
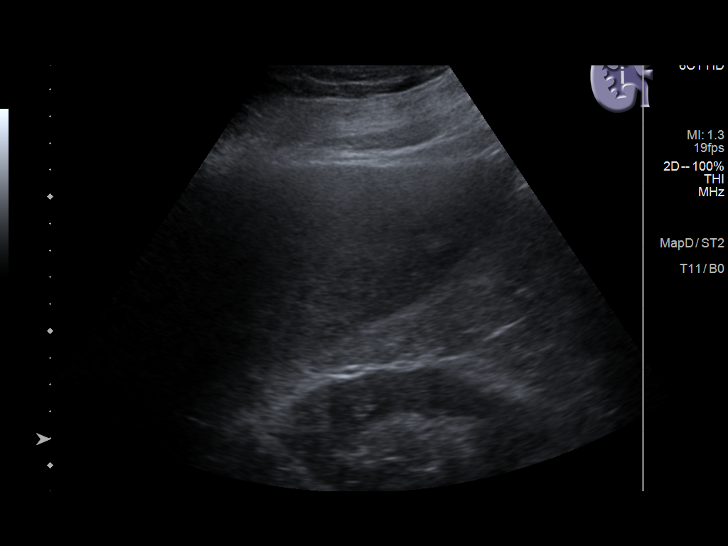
[im 4/38]
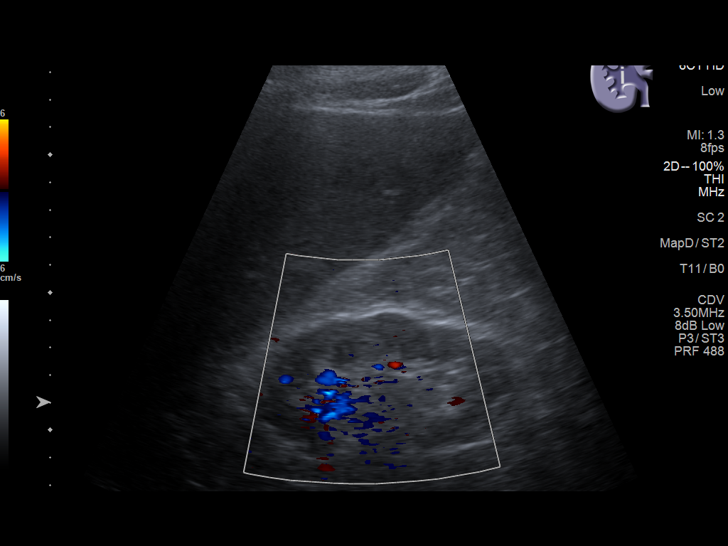
[im 7/38]
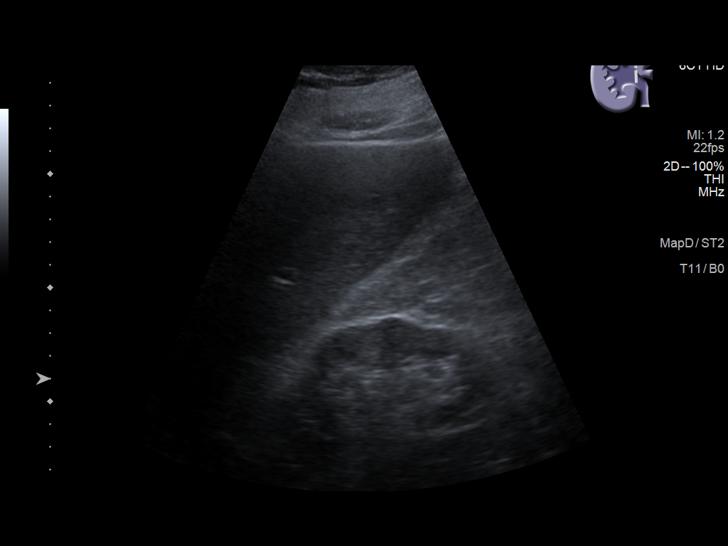
[im 10/38]
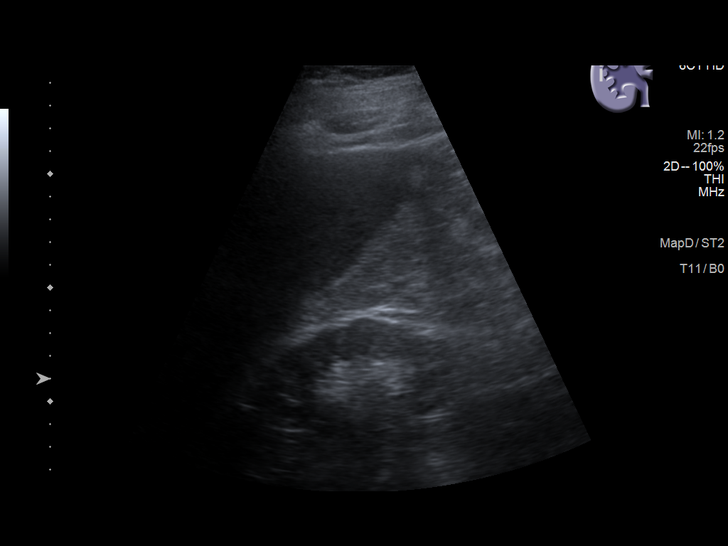
[im 13/38]
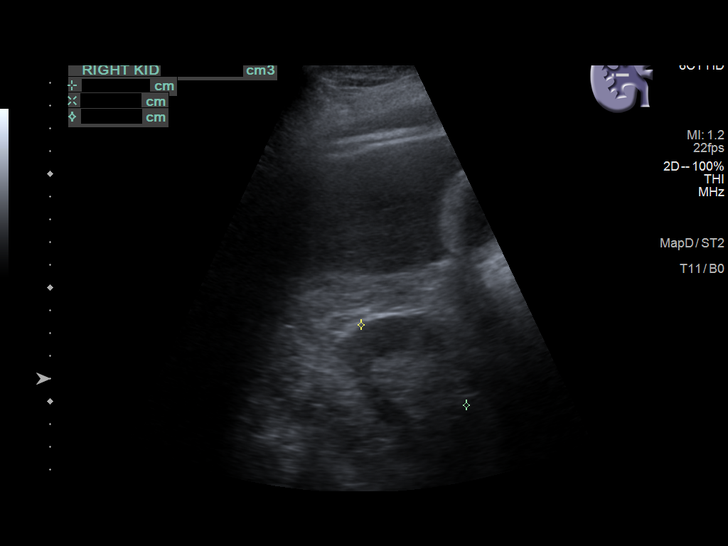
[im 14/38]
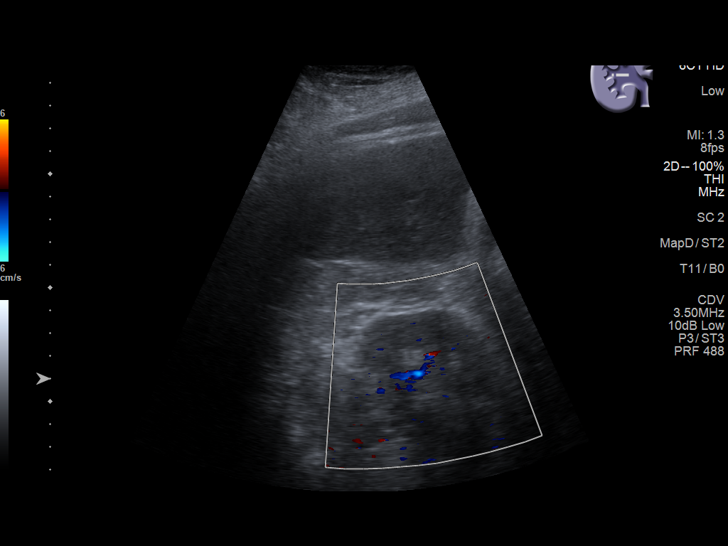
[im 17/38]
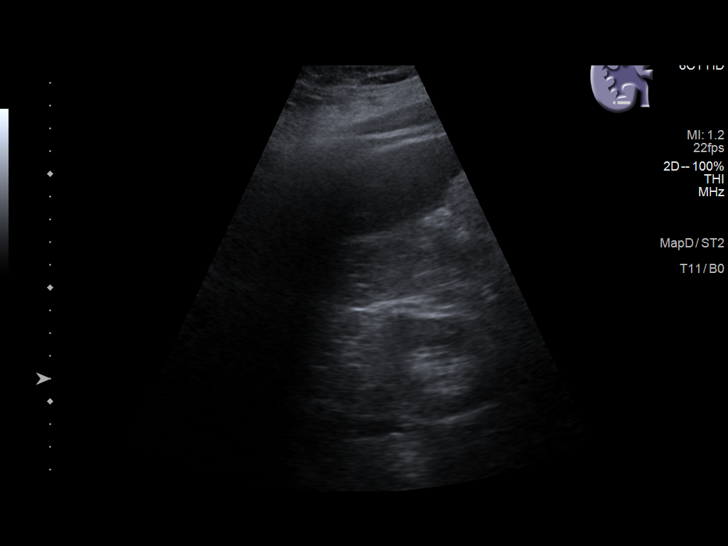
[im 21/38]
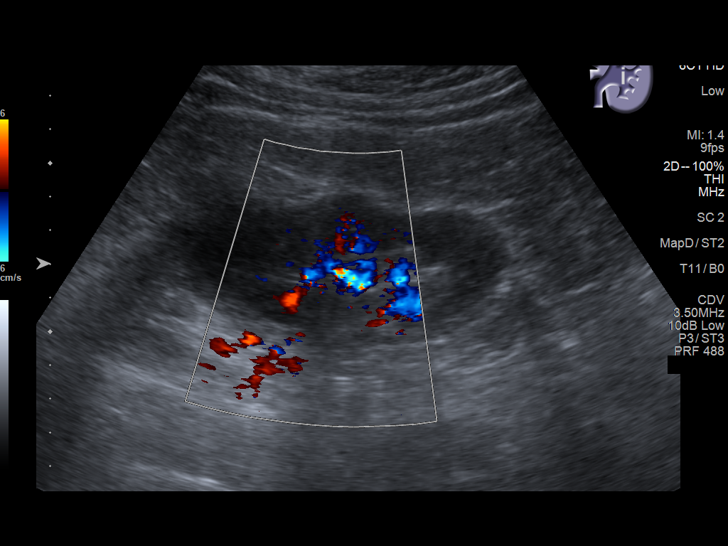
[im 24/38]
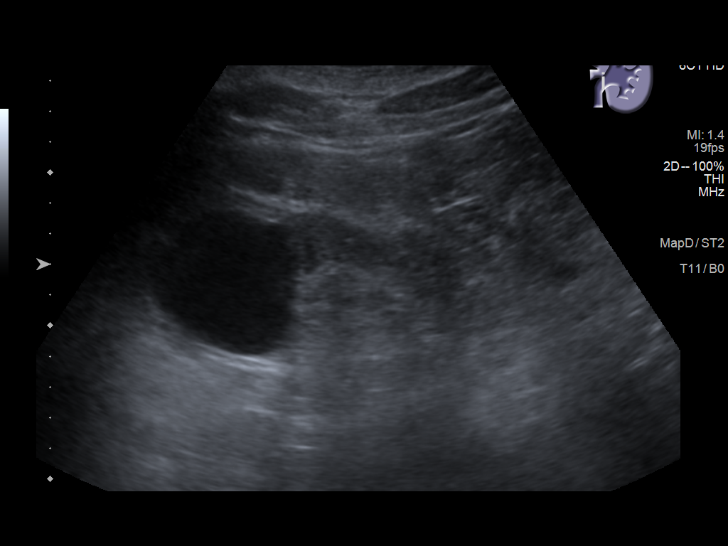
[im 25/38]
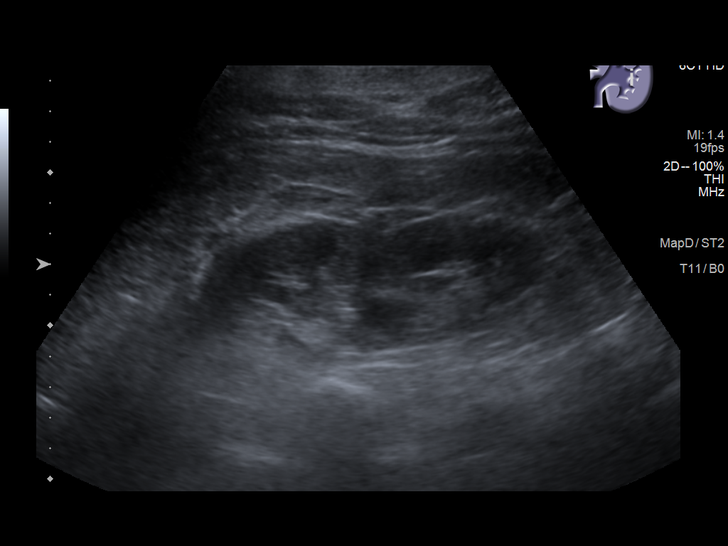
[im 28/38]
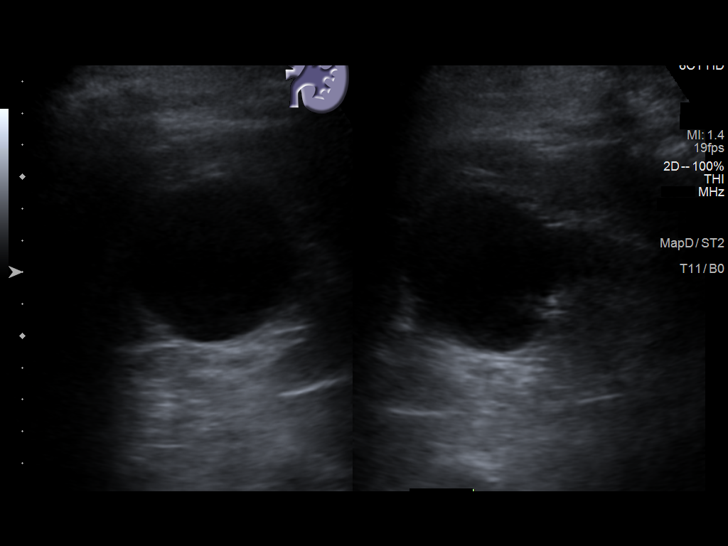
[im 31/38]
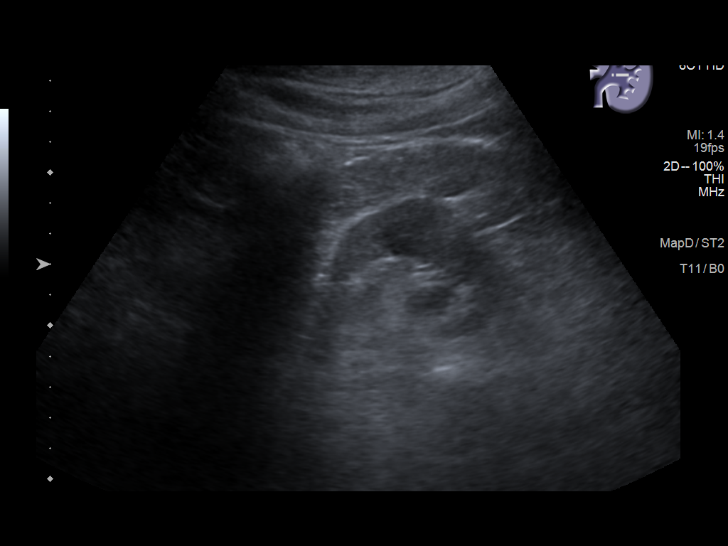
[im 34/38]
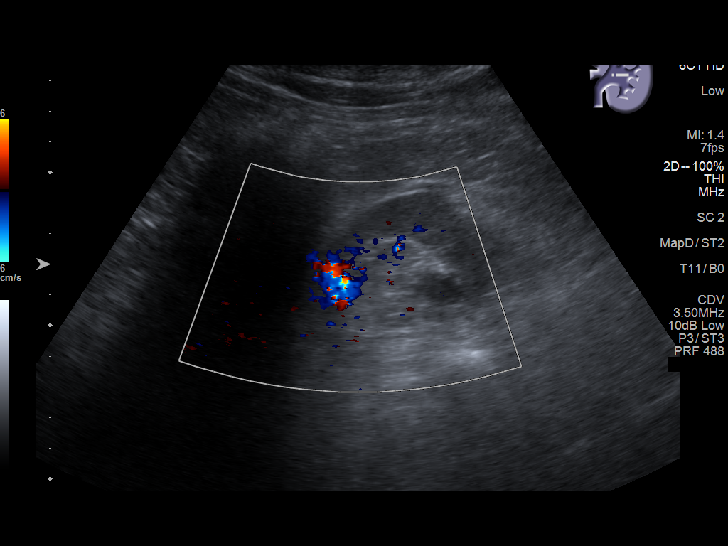
[im 38/38]
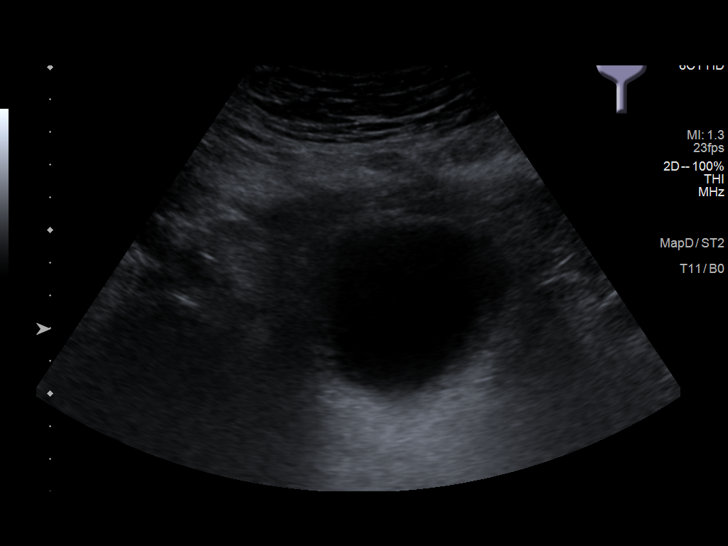

[14 of 25 positions shown; findings below may reference images not displayed]

FINDINGS: Right Kidney:

Renal measurements: 10.3 x 5.3 x 5.8 cm = volume: 166 mL. Decreased
echogenicity without hydronephrosis.

Left Kidney:

Renal measurements: 10.1 x 4.5 x 5.0 cm = volume: 120 mL. Increased
echogenicity with 5.5 cm well-defined hypoechoic lesion compatible
with cyst in the upper pole.

Bladder:

Appears normal for degree of bladder distention.

Other:

None.
IMPRESSION: 1. No evidence for hydronephrosis.
2. 5.5 cm cyst upper pole left kidney.

## 2022-01-05 ENCOUNTER — Other Ambulatory Visit
Admission: RE | Admit: 2022-01-05 | Discharge: 2022-01-05 | Disposition: A | Discharge status: Home / Self Care | Payer: Commercial Managed Care - PPO | Attending: Internal Medicine | Admitting: Internal Medicine

## 2022-01-05 DIAGNOSIS — E875 Hyperkalemia: Secondary | ICD-10-CM | POA: Insufficient documentation

## 2022-01-05 LAB — BASIC METABOLIC PANEL
Anion gap: 7 (ref 5–15)
BUN: 28 mg/dL — ABNORMAL HIGH (ref 8–23)
CO2: 29 mmol/L (ref 22–32)
Calcium: 9.2 mg/dL (ref 8.9–10.3)
Chloride: 101 mmol/L (ref 98–111)
Creatinine, Ser: 2.57 mg/dL — ABNORMAL HIGH (ref 0.61–1.24)
GFR, Estimated: 27 mL/min — ABNORMAL LOW (ref >60)
Glucose, Bld: 114 mg/dL — ABNORMAL HIGH (ref 70–99)
Potassium: 4.4 mmol/L (ref 3.5–5.1)
Sodium: 137 mmol/L (ref 135–145)

## 2022-01-05 NOTE — Telephone Encounter (Signed)
Patient returning call  Patient is heading to work - best to call back tomorrow

## 2022-01-05 NOTE — Telephone Encounter (Signed)
Repeat labs completed, see result note below:  End, Harrell Gave, MD  Solmon Ice, RN Kidney function unchanged but potassium improved and back in the normal range.  Patient should continue to hold losartan and potassium chloride.  Otherwise continue current medications.  Given these medication adjustments, I suggest he follow-up with an APP in about a month to recheck his blood pressure and volume status.   Attempted to call pt. No answer. Lmtcb.

## 2022-01-06 NOTE — Telephone Encounter (Addendum)
Attempted to call pt back. Phone goes straight to voicemail. Lmtcb.   Also called pt's s/o Christine, no answer. Lmtcb.

## 2022-01-06 NOTE — Addendum Note (Signed)
Addended by: Darlyne Russian on: 01/06/2022 11:18 AM   Modules accepted: Orders

## 2022-01-06 NOTE — Telephone Encounter (Signed)
Patient returning call.

## 2022-01-06 NOTE — Telephone Encounter (Signed)
Spoke with pt. Notified of repeat lab results and Dr. Darnelle Bos recc below.  Pt voiced understanding. Pt will:  -  Continue to HOLD Losartan and Potassium -  Continue remaining medications -  Follow up in 1 month with APP  Pt scheduled to see Christell Faith, PA-C 02/10/22 at 8:25 AM.   Pt has no further questions at this time.

## 2022-01-23 ENCOUNTER — Other Ambulatory Visit: Payer: Self-pay | Admitting: Physician Assistant

## 2022-01-23 ENCOUNTER — Telehealth: Payer: Self-pay | Admitting: Urology

## 2022-01-25 ENCOUNTER — Other Ambulatory Visit: Payer: Self-pay | Admitting: *Deleted

## 2022-01-25 MED ORDER — TAMSULOSIN HCL 0.4 MG PO CAPS: 0.4000 mg | ORAL_CAPSULE | Freq: Every day | ORAL | 1 refills | Status: DC

## 2022-01-25 NOTE — Telephone Encounter (Signed)
Left message for patient to call us back about medication .  ?

## 2022-01-25 NOTE — Telephone Encounter (Signed)
Pharmacy has called again requesting a refill on Tamulosin ?

## 2022-01-30 ENCOUNTER — Telehealth: Payer: Self-pay | Admitting: Internal Medicine

## 2022-01-30 NOTE — Telephone Encounter (Signed)
Dr. Caryl Comes, you saw this patient in March 2022 and started him on mexiletine at that time for PVC's. ?He was to wear a 3 day zio, which he did late March 2022 to follow up on his PVC's and follow up with you was pending these results.  ? ?The monitor was ordered under [facility] with these comments: ?Rise Mu, PA-C  ?02/28/2021  7:21 AM EDT   ?  ?Cardiac monitor showed a predominant rhythm of Afib (known permanent Afib) with an average ventricular rate of 71 bpm (range 30-120 bpm), occasional PVCs with a 1.8%burden and a single 3.6 second pause was noted occurring at 6:12 PM.  ?  ?When compared to prior outpatient cardiac monitor, his PVC burden is improved. He should continue current medications and follow up with Dr. Caryl Comes as directed.   ? ? ?He has no EP follow up- thoughts? ? ?I will go ahead and refill his mexiletine once you advise on follow up. ? ?Thank you! ?

## 2022-01-31 MED ORDER — MEXILETINE HCL 200 MG PO CAPS: 200.0000 mg | ORAL_CAPSULE | Freq: Two times a day (BID) | ORAL | 6 refills | Status: DC

## 2022-01-31 NOTE — Telephone Encounter (Signed)
Noted. Will plan for EP follow up after his next visit.  ?

## 2022-01-31 NOTE — Telephone Encounter (Signed)
Reviewed the patient's refill with Dr. Caryl Comes. ? ?Advised Dr. Caryl Comes the patient has no follow up scheduled with him. ?Per Dr. Caryl Comes, ok to refill mexiletine, but the patient should follow up with him. ? ?He is scheduled to see Christell Faith, PA on 02/10/22.  ? ?Will forward to [place] as an FYI, if the patient is stable, he should see Dr. Caryl Comes within 3-6 month to continue to manage his antiarrhythmic therapy.  ? ?Refill sent in for Mexiletine.  ?

## 2022-02-08 NOTE — Progress Notes (Signed)
? ?Cardiology Office Note   ? ?Date:  02/10/2022  ? ?ID:  Joshua Landry, DOB [id number], MRN [id number] ? ?PCP:  Rose Phi, FNP  ?Cardiologist:  Nelva Bush, MD  ?Electrophysiologist:  Virl Axe, MD  ? ?Chief Complaint: Follow-up ? ?History of Present Illness:  ? ?Joshua Landry is a 66 y.o. male with history of CAD status post PCI x2 to the RCA in 2011, permanent A. fib, HFrEF with subsequent normalization of LVSF by echo in 05/2021, pulmonary hypertension, CKD stage IV, DM2, prostate cancer, HTN, HLD, morbid obesity, and OSA not on CPAP who presents for hospital follow-up of his CAD, Afib, and HFrEF. ?  ?He was admitted to the hospital in 01/2010 with an NSTEMI and underwent PCI/BMS to the proximal RCA.  He underwent repeat cath in 05/2010 and was found to have significant ISR within the RCA stent and underwent PCI/DES at that time.  He was admitted in 11/2010 with another NSTEMI with subsequent cath demonstrating patent RCA stent with a possible thrombus in the PL branch and otherwise nonobstructive disease.  Echo in 12/2014 showed an EF of 20 to 25%, mildly dilated LV cavity size, mild concentric LVH, mildly dilated left atrium, mild mitral regurgitation, small right-sided pleural effusion, and a dilated IVC.  When compared to prior echo there had been a significant decline in his EF.  In this setting, he underwent cath 12/2014 that showed severe small diagonal branch mid stenosis with mild ISR of the RCA stent with medical management advised.  Echo from 2018 showed an EF of 55 to 60%, mildly increased LV septal wall thickness with concentric LVH, mildly dilated RV, mild to moderate right atrium dilatation, mildly dilated left atrium measuring 45 mm, mild dilatation of the ascending aorta measuring 3.6 cm.  Review of EKG reports in care everywhere showed his last documented time in sinus rhythm was 2012.  He has indicated his former cardiologist had previously discussed rhythm control strategy  though due to his multiple comorbid conditions rate control strategy was decided upon.   ?  ?He had been lost to follow-up from his primary cardiology group in [place], [place] since 07/2019, until his admission to [facility] in 10/2020, in the setting of recently changed jobs and inability to afford insurance during this transition.  In this setting, he ran out of his medications for approximately 4 weeks prior to his presentation to [facility] on 11/16/2020 with increased shortness of breath and bilateral lower extremity swelling.  Upon his arrival he was noted to be volume overloaded and in A. fib with RVR.  Initial high-sensitivity troponin 83 with a delta of 65.  BNP 244.  Symptoms improved with IV diuresis and rate control.  Echo showed an EF of 35 to 40%, global hypokinesis, indeterminate LV diastolic function parameters, mildly reduced RV systolic function with normal RV cavity size, moderately dilated left atrium, mildly to moderately dilated right atrium, trivial mitral regurgitation, trivial aortic insufficiency, mild aortic valve sclerosis without evidence of stenosis.  LHC was deferred, at his request, given his comorbid conditions including CKD as he indicated he would not want to be on dialysis.  He underwent Lexiscan MPI to evaluate for high risk ischemia, which showed no evidence of ischemia and was an intermediate risk study due to reduced LV systolic function.  Prior to discharge, medications were optimized as tolerated.  He was seen in hospital follow up on 11/26/2020 and was doing well from a cardiac perspective. 50 mg of Toprol XL  was added in the PM, to further optimize his regimen, and his Lasix was decreased to every other day given acute on CKD.  ?  ?He was seen in 12/2020, for routine follow up, at which time he was doing well from a cardiac perspective. His weight was stable at 270-271 pounds. Given frequent PVCs noted on 12-lead EKG, his Toprol was titrated to 100 mg bid, and he underwent a 3-day  Zio monitor that showed 100% Afub burden with an average ventricular rate of 83 bpm (range 48-146 bpm), frequent PVCs vs aberrancy with an 8.5% burden, and no prolonged pauses. ?  ?He was evaluated by EP on 01/27/2021 for evaluation of PVCs in the setting of his underlying cardiomyopathy with recommendation to add digoxin for added rate control of his Afib and management of his cardiomyopathy. With regards to his PVC burden, in the context of his cardiomyopathy, he was placed on mexiletine 200 mg bid with recommendation to repeat Zio patch in follow up. He was felt to be volume up, leading his Lasix to be increased to 40 mg every other day.  With regards to ICD, he indicated he is not interested in resuscitation.  ?  ?He was seen on 02/09/2021, at which time he was doing well from a cardiac perspective.  His weight was up 9 pounds, though he did not feel like he was volume up.  He was started on Farxiga.  Otherwise, relative hypotension precluded further escalation of GDMT.  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 ?  ?He was seen on 03/11/2021 and was doing well from a cardiac perspective.  His weight was down 13 pounds when compared to his visit in 01/2021.  He preferred a less aggressive approach to his medical care and declined A. fib clinic evaluation as well as ICD.  With regards to his cardiomyopathy, he underwent echo in 05/2021 to evaluate LVSF on maximally tolerated GDMT which demonstrated normalization of LVSF with an EF of 55%, no regional wall motion abnormalities, mild LVH, indeterminate LV diastolic function parameters, normal RV systolic function and ventricular cavity size, moderately dilated left atrium, and trivial aortic insufficiency. ?  ?He was last seen in 12/2021 and continued to do very  well from a cardiac perspective without symptoms of angina or decompensation.  He was noted to have a decline in his renal function, which is monitored by nephrology.  Labs obtained on 12/27/2021 demonstrated a progression of his renal dysfunction with a serum creatinine 2.57 and mild hyperkalemia with a potassium of 5.5.  It was recommended he hold losartan and potassium with follow-up plan for today.  Labs on 01/05/2022 demonstrated a BUN of 28, serum creatinine 2.57, and potassium 4.4.  He has since seen his nephrologist on 02/08/2022 with a possible documented serum creatinine of 2.6 and potassium 4.2, assuming I am interpreting format of documented labs appropriately. ? ?He comes in doing well from a cardiac perspective and is without symptoms of angina or decompensation.  He notes trivial ankle edema which is typically noted after working an overnight shift.  Otherwise, he does not note any significant lower extremity swelling, abdominal distention, PND, orthopnea, or early satiety.  He did have a fall approximately 2 weeks ago after standing from a sitting position.  Otherwise, he denies any further falls, hematochezia,  or melena.  No symptoms of dizziness.  His weight remains stable.  He is monitoring his salt and fluid intake.  He continues to follow closely with nephrology given his underlying renal dysfunction.  He continues to decline ICD and is uncertain if he would want to pursue renal transplant or dialysis should his renal function continued to decline. ? ? ?Labs independently reviewed: ?12/2021 - potassium 4.4, BUN 28, serum creatinine 2.57, TC 104, TG 159, HDL 28, LDL 49, Hgb 17.6, PLT 217, albumin 4.7, AST/ALT normal ?09/2021 - A1c 7.2 ?02/2021 - TSH normal ? ?Past Medical History:  ?Diagnosis Date  ? (HFpEF) heart failure with preserved ejection fraction ([place])   ? CAD (coronary artery disease)   ? Chronic kidney disease (CKD), stage III (moderate) ([facility])   ? Diabetes mellitus without complication ([place])    ? Essential hypertension   ? Hyperlipidemia   ? Morbid obesity ([place])   ? OSA (obstructive sleep apnea)   ? Permanent atrial fibrillation ([place])   ? Prostate CA [facility])   ? ? ?Past Surgical History:  ?Procedur

## 2022-02-10 ENCOUNTER — Encounter: Payer: Self-pay | Admitting: Physician Assistant

## 2022-02-10 ENCOUNTER — Other Ambulatory Visit: Payer: Self-pay

## 2022-02-10 ENCOUNTER — Ambulatory Visit: Payer: Commercial Managed Care - PPO | Admitting: Physician Assistant

## 2022-02-10 VITALS — BP 124/82 | HR 74 | Ht 71.0 in | Wt 274.2 lb

## 2022-02-10 DIAGNOSIS — I5020 Unspecified systolic (congestive) heart failure: Secondary | ICD-10-CM | POA: Diagnosis not present

## 2022-02-10 DIAGNOSIS — I2510 Atherosclerotic heart disease of native coronary artery without angina pectoris: Secondary | ICD-10-CM

## 2022-02-10 DIAGNOSIS — I493 Ventricular premature depolarization: Secondary | ICD-10-CM

## 2022-02-10 DIAGNOSIS — G4733 Obstructive sleep apnea (adult) (pediatric): Secondary | ICD-10-CM

## 2022-02-10 DIAGNOSIS — I4821 Permanent atrial fibrillation: Secondary | ICD-10-CM | POA: Diagnosis not present

## 2022-02-10 DIAGNOSIS — I272 Pulmonary hypertension, unspecified: Secondary | ICD-10-CM

## 2022-02-10 DIAGNOSIS — N184 Chronic kidney disease, stage 4 (severe): Secondary | ICD-10-CM

## 2022-02-10 DIAGNOSIS — E785 Hyperlipidemia, unspecified: Secondary | ICD-10-CM

## 2022-02-10 DIAGNOSIS — I10 Essential (primary) hypertension: Secondary | ICD-10-CM

## 2022-02-10 MED ORDER — FUROSEMIDE 20 MG PO TABS: 20.0000 mg | ORAL_TABLET | Freq: Every day | ORAL | 3 refills | Status: AC

## 2022-02-10 NOTE — Patient Instructions (Addendum)
Medication Instructions:  ?Your physician has recommended you make the following change in your medication:  ? ?DECREASE Furosemide (Lasix) to 20 mg once daily ? ?*If you need a refill on your cardiac medications before your next appointment, please call your pharmacy* ? ? ?Lab Work: ?None ? ?If you have labs (blood work) drawn today and your tests are completely normal, you will receive your results only by: ?MyChart Message (if you have MyChart) OR ?A paper copy in the mail ?If you have any lab test that is abnormal or we need to change your treatment, we will call you to review the results. ? ? ?Testing/Procedures: ?None ? ? ?Follow-Up: ?At [facility], you and your health needs are our priority.  As part of our continuing mission to provide you with exceptional heart care, we have created designated Provider Care Teams.  These Care Teams include your primary Cardiologist (physician) and Advanced Practice Providers (APPs -  Physician Assistants and Nurse Practitioners) who all work together to provide you with the care you need, when you need it. ? ? ?Your next appointment:   ? ?Follow up with Dr. Caryl Comes in 3 months for monitoring of mexiletine. ? ?Follow up with Dr. Harrell Gave End or Christell Faith PA-C in 6 months ?

## 2022-02-17 ENCOUNTER — Ambulatory Visit: Payer: Commercial Managed Care - PPO | Admitting: Physician Assistant

## 2022-04-13 ENCOUNTER — Other Ambulatory Visit: Payer: Self-pay | Admitting: Physician Assistant

## 2022-04-27 ENCOUNTER — Other Ambulatory Visit: Payer: Self-pay | Admitting: Urology

## 2022-05-18 ENCOUNTER — Ambulatory Visit: Payer: Commercial Managed Care - PPO | Admitting: Internal Medicine

## 2022-05-24 ENCOUNTER — Other Ambulatory Visit: Payer: Self-pay | Admitting: Physician Assistant

## 2022-05-24 NOTE — Telephone Encounter (Signed)
Please review Eliquis for refill. Thank you!

## 2022-05-24 NOTE — Telephone Encounter (Signed)
Rx request sent to pharmacy.  

## 2022-05-25 NOTE — Telephone Encounter (Signed)
Prescription refill request for Eliquis received. Indication:Afib Last office visit:6/23 Scr:2.5 Age: 66 Weight:124.4 kg  Prescription refilled

## 2022-07-24 ENCOUNTER — Other Ambulatory Visit: Payer: Self-pay | Admitting: Physician Assistant

## 2022-08-03 ENCOUNTER — Ambulatory Visit: Payer: Commercial Managed Care - PPO | Admitting: Internal Medicine

## 2022-08-07 ENCOUNTER — Other Ambulatory Visit: Payer: Self-pay | Admitting: Internal Medicine

## 2022-08-07 ENCOUNTER — Other Ambulatory Visit: Payer: Self-pay | Admitting: Urology

## 2022-08-18 ENCOUNTER — Encounter: Payer: Self-pay | Admitting: Urology

## 2022-08-30 ENCOUNTER — Other Ambulatory Visit: Payer: Self-pay | Admitting: Physician Assistant

## 2022-09-14 ENCOUNTER — Other Ambulatory Visit: Payer: Self-pay | Admitting: Internal Medicine

## 2022-10-02 ENCOUNTER — Other Ambulatory Visit: Payer: Self-pay | Admitting: Physician Assistant

## 2022-10-15 ENCOUNTER — Other Ambulatory Visit: Payer: Self-pay | Admitting: Internal Medicine

## 2022-11-20 ENCOUNTER — Other Ambulatory Visit: Payer: Self-pay | Admitting: Physician Assistant

## 2022-11-20 ENCOUNTER — Other Ambulatory Visit: Payer: Self-pay | Admitting: Urology

## 2022-11-20 ENCOUNTER — Other Ambulatory Visit: Payer: Self-pay | Admitting: Internal Medicine

## 2022-11-21 NOTE — Telephone Encounter (Signed)
Refill request

## 2022-11-21 NOTE — Telephone Encounter (Signed)
Prescription refill request for Eliquis received. Indication:afib Last office visit:12/23 Scr:2.3 Age: 67 Weight:124.4  kg  Prescription refilled

## 2022-12-07 ENCOUNTER — Other Ambulatory Visit: Payer: Self-pay | Admitting: Physician Assistant

## 2022-12-21 ENCOUNTER — Other Ambulatory Visit: Payer: Commercial Managed Care - PPO

## 2022-12-22 ENCOUNTER — Ambulatory Visit: Payer: Commercial Managed Care - PPO | Admitting: Urology

## 2022-12-27 ENCOUNTER — Ambulatory Visit: Payer: Commercial Managed Care - PPO | Admitting: Urology

## 2023-01-04 ENCOUNTER — Other Ambulatory Visit: Payer: Self-pay | Admitting: Physician Assistant

## 2023-03-26 ENCOUNTER — Other Ambulatory Visit: Payer: Self-pay | Admitting: Physician Assistant

## 2023-03-26 DIAGNOSIS — I5020 Unspecified systolic (congestive) heart failure: Secondary | ICD-10-CM

## 2023-05-07 ENCOUNTER — Other Ambulatory Visit: Payer: Self-pay | Admitting: Physician Assistant

## 2023-05-07 NOTE — Telephone Encounter (Signed)
Please review

## 2023-11-12 ENCOUNTER — Other Ambulatory Visit: Payer: Self-pay | Admitting: Physician Assistant
